# Patient Record
Sex: Female | Born: 1990 | Race: Black or African American | Hispanic: No | Marital: Single | Smoking: Never smoker
Health system: Southern US, Community
[De-identification: ages and names within clinical notes are randomized; demographics above are authoritative.]

## PROBLEM LIST (undated history)

## (undated) ENCOUNTER — Inpatient Hospital Stay: Payer: Self-pay

## (undated) DIAGNOSIS — M5430 Sciatica, unspecified side: Secondary | ICD-10-CM

## (undated) DIAGNOSIS — N63 Unspecified lump in unspecified breast: Secondary | ICD-10-CM

## (undated) DIAGNOSIS — R0602 Shortness of breath: Secondary | ICD-10-CM

## (undated) HISTORY — DX: Shortness of breath: R06.02

## (undated) HISTORY — PX: EYE SURGERY: SHX253

## (undated) HISTORY — DX: Sciatica, unspecified side: M54.30

---
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
## 2011-04-08 ENCOUNTER — Emergency Department: Payer: Self-pay | Admitting: Emergency Medicine

## 2011-04-08 LAB — COMPREHENSIVE METABOLIC PANEL
Albumin: 3.9 g/dL (ref 3.8–5.6)
Anion Gap: 9 (ref 7–16)
BUN: 7 mg/dL (ref 7–18)
Bilirubin,Total: 0.3 mg/dL (ref 0.2–1.0)
Chloride: 102 mmol/L (ref 98–107)
Co2: 28 mmol/L (ref 21–32)
Creatinine: 0.49 mg/dL — ABNORMAL LOW (ref 0.60–1.30)
EGFR (African American): >60
Glucose: 78 mg/dL (ref 65–99)
Osmolality: 274 (ref 275–301)
Potassium: 3.7 mmol/L (ref 3.5–5.1)
SGOT(AST): 17 U/L (ref 0–26)
SGPT (ALT): 13 U/L
Sodium: 139 mmol/L (ref 136–145)
Total Protein: 8.7 g/dL — ABNORMAL HIGH (ref 6.4–8.6)

## 2011-04-08 LAB — URINALYSIS, COMPLETE
Bacteria: NONE SEEN
Bilirubin,UR: NEGATIVE
Blood: NEGATIVE
Glucose,UR: NEGATIVE mg/dL (ref 0–75)
Ketone: NEGATIVE
Ph: 5 (ref 4.5–8.0)
Protein: NEGATIVE
RBC,UR: 1 /HPF (ref 0–5)
Specific Gravity: 1.01 (ref 1.003–1.030)
Squamous Epithelial: 12

## 2011-04-08 LAB — CBC
Platelet: 303 10*3/uL (ref 150–440)
RBC: 4.2 10*6/uL (ref 3.80–5.20)
WBC: 5.7 10*3/uL (ref 3.6–11.0)

## 2011-04-08 LAB — PREGNANCY, URINE: Pregnancy Test, Urine: NEGATIVE m[IU]/mL

## 2011-06-23 ENCOUNTER — Emergency Department: Payer: Self-pay | Admitting: Internal Medicine

## 2011-07-17 ENCOUNTER — Emergency Department: Payer: Self-pay | Admitting: Emergency Medicine

## 2011-07-17 LAB — URINALYSIS, COMPLETE
Bilirubin,UR: NEGATIVE
Blood: NEGATIVE
Glucose,UR: NEGATIVE mg/dL (ref 0–75)
Ketone: NEGATIVE
Ph: 7 (ref 4.5–8.0)
Protein: NEGATIVE
Squamous Epithelial: 17
WBC UR: 6 /HPF (ref 0–5)

## 2011-07-17 LAB — COMPREHENSIVE METABOLIC PANEL
Albumin: 3.7 g/dL — ABNORMAL LOW (ref 3.8–5.6)
Bilirubin,Total: 0.2 mg/dL (ref 0.2–1.0)
Chloride: 102 mmol/L (ref 98–107)
Co2: 30 mmol/L (ref 21–32)
Creatinine: 0.74 mg/dL (ref 0.60–1.30)
EGFR (Non-African Amer.): >60
Glucose: 88 mg/dL (ref 65–99)
Osmolality: 278 (ref 275–301)
SGPT (ALT): 13 U/L

## 2011-07-17 LAB — CBC
HCT: 34.7 % — ABNORMAL LOW (ref 35.0–47.0)
HGB: 11.1 g/dL — ABNORMAL LOW (ref 12.0–16.0)
MCH: 25.9 pg — ABNORMAL LOW (ref 26.0–34.0)
MCHC: 31.9 g/dL — ABNORMAL LOW (ref 32.0–36.0)
MCV: 81 fL (ref 80–100)
Platelet: 269 10*3/uL (ref 150–440)
RBC: 4.26 10*6/uL (ref 3.80–5.20)
WBC: 4.1 10*3/uL (ref 3.6–11.0)

## 2011-08-17 ENCOUNTER — Emergency Department: Payer: Self-pay | Admitting: Emergency Medicine

## 2011-08-18 LAB — URINALYSIS, COMPLETE
Bilirubin,UR: NEGATIVE
Glucose,UR: NEGATIVE mg/dL (ref 0–75)
Nitrite: NEGATIVE
Ph: 6 (ref 4.5–8.0)
Protein: 30
RBC,UR: 46 /HPF (ref 0–5)
WBC UR: 19 /HPF (ref 0–5)

## 2011-10-24 ENCOUNTER — Emergency Department: Payer: Self-pay | Admitting: *Deleted

## 2011-10-24 LAB — COMPREHENSIVE METABOLIC PANEL
BUN: 9 mg/dL (ref 7–18)
Calcium, Total: 8.6 mg/dL (ref 8.5–10.1)
Chloride: 105 mmol/L (ref 98–107)
Co2: 23 mmol/L (ref 21–32)
Creatinine: 0.6 mg/dL (ref 0.60–1.30)
EGFR (African American): >60
Osmolality: 272 (ref 275–301)
Potassium: 3.6 mmol/L (ref 3.5–5.1)
SGOT(AST): 26 U/L (ref 15–37)
SGPT (ALT): 18 U/L
Total Protein: 7 g/dL (ref 6.4–8.2)

## 2011-10-24 LAB — CBC
HCT: 31 % — ABNORMAL LOW (ref 35.0–47.0)
HGB: 10.5 g/dL — ABNORMAL LOW (ref 12.0–16.0)
MCHC: 33.8 g/dL (ref 32.0–36.0)
Platelet: 207 10*3/uL (ref 150–440)
RBC: 3.82 10*6/uL (ref 3.80–5.20)
RDW: 14.8 % — ABNORMAL HIGH (ref 11.5–14.5)
WBC: 7.6 10*3/uL (ref 3.6–11.0)

## 2011-10-24 LAB — URINALYSIS, COMPLETE
Bilirubin,UR: NEGATIVE
Glucose,UR: NEGATIVE mg/dL (ref 0–75)
Nitrite: NEGATIVE
RBC,UR: 1 /HPF (ref 0–5)
Squamous Epithelial: 4
WBC UR: 1 /HPF (ref 0–5)

## 2011-10-24 LAB — PREGNANCY, URINE: Pregnancy Test, Urine: POSITIVE m[IU]/mL

## 2011-10-26 ENCOUNTER — Emergency Department: Payer: Self-pay | Admitting: Emergency Medicine

## 2011-10-26 LAB — COMPREHENSIVE METABOLIC PANEL
Albumin: 3.2 g/dL — ABNORMAL LOW (ref 3.4–5.0)
Anion Gap: 7 (ref 7–16)
BUN: 7 mg/dL (ref 7–18)
Calcium, Total: 8.8 mg/dL (ref 8.5–10.1)
Chloride: 101 mmol/L (ref 98–107)
Co2: 26 mmol/L (ref 21–32)
Creatinine: 0.43 mg/dL — ABNORMAL LOW (ref 0.60–1.30)
EGFR (African American): >60
EGFR (Non-African Amer.): >60
Glucose: 73 mg/dL (ref 65–99)
Osmolality: 265 (ref 275–301)
Potassium: 3.7 mmol/L (ref 3.5–5.1)
SGOT(AST): 14 U/L — ABNORMAL LOW (ref 15–37)
SGPT (ALT): 17 U/L
Sodium: 134 mmol/L — ABNORMAL LOW (ref 136–145)
Total Protein: 7.6 g/dL (ref 6.4–8.2)

## 2011-10-26 LAB — CBC
HCT: 34.7 % — ABNORMAL LOW (ref 35.0–47.0)
MCHC: 33.7 g/dL (ref 32.0–36.0)
MCV: 82 fL (ref 80–100)
RDW: 14.6 % — ABNORMAL HIGH (ref 11.5–14.5)

## 2011-10-26 LAB — HCG, QUANTITATIVE, PREGNANCY: Beta Hcg, Quant.: 70778 m[IU]/mL — ABNORMAL HIGH

## 2011-11-21 ENCOUNTER — Encounter: Payer: Self-pay | Admitting: Maternal & Fetal Medicine

## 2012-03-28 ENCOUNTER — Inpatient Hospital Stay (HOSPITAL_COMMUNITY): Admission: AD | Admit: 2012-03-28 | Payer: Self-pay | Source: Ambulatory Visit | Admitting: Obstetrics & Gynecology

## 2012-04-05 ENCOUNTER — Observation Stay: Payer: Self-pay | Admitting: Obstetrics and Gynecology

## 2012-04-07 ENCOUNTER — Inpatient Hospital Stay: Payer: Self-pay | Admitting: Obstetrics and Gynecology

## 2012-04-07 LAB — CBC WITH DIFFERENTIAL/PLATELET
Basophil #: 0 10*3/uL (ref 0.0–0.1)
Eosinophil %: 0.6 %
Lymphocyte #: 1.3 10*3/uL (ref 1.0–3.6)
MCH: 23.7 pg — ABNORMAL LOW (ref 26.0–34.0)
Monocyte #: 1.6 x10 3/mm — ABNORMAL HIGH (ref 0.2–0.9)
Monocyte %: 10.9 %
Neutrophil #: 12.1 10*3/uL — ABNORMAL HIGH (ref 1.4–6.5)
Platelet: 199 10*3/uL (ref 150–440)
WBC: 15.1 10*3/uL — ABNORMAL HIGH (ref 3.6–11.0)

## 2012-04-08 LAB — CBC WITH DIFFERENTIAL/PLATELET
Comment - H1-Com1: NORMAL
HCT: 26.4 % — ABNORMAL LOW (ref 35.0–47.0)
Lymphocytes: 12 %
MCH: 23.2 pg — ABNORMAL LOW (ref 26.0–34.0)
MCV: 74 fL — ABNORMAL LOW (ref 80–100)
RBC: 3.56 10*6/uL — ABNORMAL LOW (ref 3.80–5.20)
Segmented Neutrophils: 77 %
WBC: 20.6 10*3/uL — ABNORMAL HIGH (ref 3.6–11.0)

## 2012-04-08 LAB — CREATININE, SERUM
EGFR (African American): >60
EGFR (Non-African Amer.): >60

## 2012-04-09 LAB — CBC WITH DIFFERENTIAL/PLATELET
Basophil %: 0.3 %
Eosinophil #: 0.2 10*3/uL (ref 0.0–0.7)
Lymphocyte %: 16.4 %
MCH: 22.3 pg — ABNORMAL LOW (ref 26.0–34.0)
MCV: 75 fL — ABNORMAL LOW (ref 80–100)
Monocyte #: 1.4 x10 3/mm — ABNORMAL HIGH (ref 0.2–0.9)
Monocyte %: 9.1 %
Neutrophil #: 10.9 10*3/uL — ABNORMAL HIGH (ref 1.4–6.5)
Platelet: 193 10*3/uL (ref 150–440)
RBC: 3.43 10*6/uL — ABNORMAL LOW (ref 3.80–5.20)
RDW: 16.4 % — ABNORMAL HIGH (ref 11.5–14.5)
WBC: 14.9 10*3/uL — ABNORMAL HIGH (ref 3.6–11.0)

## 2012-04-09 LAB — PATHOLOGY REPORT

## 2012-05-16 ENCOUNTER — Emergency Department: Payer: Self-pay | Admitting: Emergency Medicine

## 2012-07-03 ENCOUNTER — Emergency Department: Payer: Self-pay | Admitting: Emergency Medicine

## 2012-07-03 LAB — CBC
HCT: 35.2 % (ref 35.0–47.0)
HGB: 11.2 g/dL — ABNORMAL LOW (ref 12.0–16.0)
MCH: 24.1 pg — ABNORMAL LOW (ref 26.0–34.0)
MCHC: 31.9 g/dL — ABNORMAL LOW (ref 32.0–36.0)
MCV: 76 fL — ABNORMAL LOW (ref 80–100)
Platelet: 263 10*3/uL (ref 150–440)
RBC: 4.66 10*6/uL (ref 3.80–5.20)
WBC: 6.5 10*3/uL (ref 3.6–11.0)

## 2012-07-03 LAB — COMPREHENSIVE METABOLIC PANEL
Alkaline Phosphatase: 74 U/L (ref 50–136)
BUN: 11 mg/dL (ref 7–18)
Bilirubin,Total: 0.5 mg/dL (ref 0.2–1.0)
Calcium, Total: 8.6 mg/dL (ref 8.5–10.1)
Chloride: 103 mmol/L (ref 98–107)
Co2: 24 mmol/L (ref 21–32)
Potassium: 3.7 mmol/L (ref 3.5–5.1)
SGOT(AST): 13 U/L — ABNORMAL LOW (ref 15–37)
SGPT (ALT): 13 U/L (ref 12–78)
Sodium: 135 mmol/L — ABNORMAL LOW (ref 136–145)
Total Protein: 8 g/dL (ref 6.4–8.2)

## 2012-07-03 LAB — URINALYSIS, COMPLETE
Bilirubin,UR: NEGATIVE
Blood: NEGATIVE
Glucose,UR: NEGATIVE mg/dL (ref 0–75)
Protein: 30
RBC,UR: 1 /HPF (ref 0–5)
WBC UR: 1 /HPF (ref 0–5)

## 2012-07-03 LAB — LIPASE, BLOOD: Lipase: 84 U/L (ref 73–393)

## 2012-12-27 ENCOUNTER — Emergency Department: Payer: Self-pay | Admitting: Internal Medicine

## 2013-03-01 ENCOUNTER — Emergency Department: Payer: Self-pay | Admitting: Emergency Medicine

## 2013-03-01 LAB — BASIC METABOLIC PANEL
Anion Gap: 3 — ABNORMAL LOW (ref 7–16)
Calcium, Total: 9 mg/dL (ref 8.5–10.1)
Chloride: 100 mmol/L (ref 98–107)
Co2: 28 mmol/L (ref 21–32)
Creatinine: 0.61 mg/dL (ref 0.60–1.30)
EGFR (African American): >60
Glucose: 109 mg/dL — ABNORMAL HIGH (ref 65–99)
Osmolality: 260 (ref 275–301)
Potassium: 3.5 mmol/L (ref 3.5–5.1)
Sodium: 131 mmol/L — ABNORMAL LOW (ref 136–145)

## 2013-03-01 LAB — URINALYSIS, COMPLETE
Blood: NEGATIVE
Glucose,UR: NEGATIVE mg/dL (ref 0–75)
Leukocyte Esterase: NEGATIVE
Nitrite: NEGATIVE
Ph: 7 (ref 4.5–8.0)
Protein: NEGATIVE
RBC,UR: <1 /HPF (ref 0–5)
Squamous Epithelial: NONE SEEN

## 2013-03-01 LAB — CBC WITH DIFFERENTIAL/PLATELET
Basophil #: 0 10*3/uL (ref 0.0–0.1)
Eosinophil #: 0.1 10*3/uL (ref 0.0–0.7)
HGB: 11.8 g/dL — ABNORMAL LOW (ref 12.0–16.0)
Lymphocyte #: 0.4 10*3/uL — ABNORMAL LOW (ref 1.0–3.6)
MCH: 26.3 pg (ref 26.0–34.0)
MCHC: 32.9 g/dL (ref 32.0–36.0)
Monocyte #: 0.4 x10 3/mm (ref 0.2–0.9)
Monocyte %: 4.1 %
Neutrophil #: 8.4 10*3/uL — ABNORMAL HIGH (ref 1.4–6.5)
RBC: 4.5 10*6/uL (ref 3.80–5.20)
WBC: 9.2 10*3/uL (ref 3.6–11.0)

## 2013-03-01 LAB — RAPID INFLUENZA A&B ANTIGENS

## 2013-03-04 LAB — BETA STREP CULTURE(ARMC)

## 2013-06-21 ENCOUNTER — Emergency Department: Payer: Self-pay | Admitting: Emergency Medicine

## 2014-04-01 NOTE — L&D Delivery Note (Signed)
[facility]  Labor Events:   Preterm labor:   Rupture date:   Rupture time:   Rupture type: Spontaneous  Fluid Color: Clear  Induction:   Augmentation:   Complications:   Cervical ripening:          Delivery:   Episiotomy:   Lacerations:   Repair suture:   Repair # of packets:   Blood loss (ml): 150 ml   Information for the patient's newborn:  Esmond PlantsMiles, Girl [name] [[id number]][[id number]]    Delivery 01/15/2015 2:31 PM by  Vaginal, Spontaneous Delivery Sex:  female Gestational Age: 5769w6d Delivery Clinician:  Hildred LaserAnika Chinwe Lope Living?: Yes        APGARS  One minute Five minutes Ten minutes  Skin color: 0   1      Heart rate: 2   2      Grimace: 2   2      Muscle tone: 2   2      Breathing: 2   2      Totals: 8  9      Presentation/position: Vertex  Left Occiput Anterior Resuscitation: None  Cord information: 3 vessels   Disposition of cord blood:     Blood gases sent?  Complications:   Placenta: Delivered: 01/15/2015 2:39 PM     appearance Newborn Measurements: Weight: 6 lb 3.5 oz (2820 g)  Height: 18.9"  Head circumference: 30.5 cm  Chest circumference: 30.5 cm  Other providers:   Registered Nurse Corrie DandyJolene T Cannady Cindy A Tretha SciaraSharpe Jana M Grindheim  Additional  information: Forceps:   Vacuum:   Breech:   Observed anomalies         Delivery Note At 2:31 PM a viable and healthy female was delivered via Vaginal, Spontaneous Delivery (Presentation: Left Occiput Anterior).  APGAR: 8, 9; weight  .   Placenta status: spntaneous, intact.  Cord: 3 vessels with the following complications: .  Cord pH: not collected.  Cord blood collected.   Anesthesia: Epidural  Episiotomy: None Lacerations:  1st degree biateral periurethral; hemostatic Suture Repair: none Est. Blood Loss (mL):  150  Mom to postpartum.  Baby to Couplet care / Skin to Skin.  Hildred Lasernika Kemara Quigley 01/15/2015, 2:56 PM

## 2014-04-05 ENCOUNTER — Emergency Department: Payer: Self-pay | Admitting: Emergency Medicine

## 2014-06-01 ENCOUNTER — Emergency Department: Payer: Self-pay | Admitting: Student

## 2014-06-29 ENCOUNTER — Emergency Department
Admit: 2014-06-29 | Disposition: A | Discharge status: Home / Self Care | Payer: Self-pay | Admitting: Emergency Medicine

## 2014-07-08 LAB — OB RESULTS CONSOLE ABO/RH: RH TYPE: POSITIVE

## 2014-07-08 LAB — SICKLE CELL SCREEN: SICKLE CELL SCREEN: NEGATIVE

## 2014-07-08 LAB — OB RESULTS CONSOLE PLATELET COUNT: Platelets: 265 10*3/uL

## 2014-07-08 LAB — OB RESULTS CONSOLE GC/CHLAMYDIA
CHLAMYDIA, DNA PROBE: NEGATIVE
Gonorrhea: NEGATIVE

## 2014-07-08 LAB — OB RESULTS CONSOLE ANTIBODY SCREEN: ANTIBODY SCREEN: NEGATIVE

## 2014-07-08 LAB — OB RESULTS CONSOLE HIV ANTIBODY (ROUTINE TESTING): HIV: NONREACTIVE

## 2014-07-08 LAB — OB RESULTS CONSOLE RUBELLA ANTIBODY, IGM: Rubella: IMMUNE

## 2014-07-08 LAB — OB RESULTS CONSOLE HEPATITIS B SURFACE ANTIGEN: HEP B S AG: NEGATIVE

## 2014-07-08 LAB — OB RESULTS CONSOLE RPR: RPR: NONREACTIVE

## 2014-07-08 LAB — OB RESULTS CONSOLE VARICELLA ZOSTER ANTIBODY, IGG: VARICELLA IGG: IMMUNE

## 2014-07-08 LAB — OB RESULTS CONSOLE HGB/HCT, BLOOD
HCT: 36 %
Hemoglobin: 12 g/dL

## 2014-08-09 NOTE — H&P (Signed)
L&D Evaluation:  History:   HPI 24 year old G1 P0 with EDC=04/06/2012 by LMP=07/01/2011 and confirmed with a 9 week ultrasound presents at 40 1/7 weeks with c/o onset strong regular contractions at 0100 this AM and ctxs are now q6 min apart. Has had some bloody show, but no LOF. [facility] for [facility] onset of care and  a positive Chlamydia NAA in May and July with  negative TOC x2. Prenatal care also remarkable for a negative first trimester test and MSAFP, transient oligohydraminos at 20 weeks, and anemia. LABS: O POS, RI, VI, GBS negative.    Presents with contractions    Patient's Medical History Anemia    Patient's Surgical History none    Medications Pre Natal Vitamins    Allergies NKDA    Social History none    Family History Non-Contributory   ROS:   ROS All systems were reviewed.  HEENT, CNS, GI, GU, Respiratory, CV, Renal and Musculoskeletal systems were found to be normal.   Exam:   Vital Signs stable  initial temp=99    Urine Protein not completed    General breathing with contractions    Mental Status clear    Chest clear    Heart normal sinus rhythm, no murmur/gallop/rubs    Abdomen gravid, tender with contractions    Estimated Fetal Weight Average for gestational age    Fetal Position cephalic    Edema no edema    Reflexes 1+    Pelvic no external lesions, initially 2-2.5cm now 3/80%/-1    Mebranes Intact    FHT Description initially 150s-160 with mod variability. After IV fluid bolus baseline 140s with  accels to 160s to 170, mod variability    Ucx regular, q4-5 min    Skin dry   Impression:   Impression IUP at 40 1/7 weeks in early labor   Plan:   Plan EFM/NST, monitor contractions and for cervical change, IV bolus. Stadol for pain, epidural when in active labor   Electronic Signatures: Trinna BalloonGutierrez, Inara Dike L (CNM)  (Signed 07-Jan-14 11:20)  Authored: L&D Evaluation   Last Updated: 07-Jan-14 11:20 by Trinna BalloonGutierrez,  Sharisa Toves L (CNM)

## 2014-08-31 ENCOUNTER — Other Ambulatory Visit: Payer: Self-pay | Admitting: Obstetrics and Gynecology

## 2014-08-31 DIAGNOSIS — Z3490 Encounter for supervision of normal pregnancy, unspecified, unspecified trimester: Secondary | ICD-10-CM

## 2014-09-09 ENCOUNTER — Other Ambulatory Visit: Payer: Self-pay

## 2014-09-09 DIAGNOSIS — Z369 Encounter for antenatal screening, unspecified: Secondary | ICD-10-CM

## 2014-09-12 DIAGNOSIS — R0602 Shortness of breath: Secondary | ICD-10-CM | POA: Insufficient documentation

## 2014-09-12 DIAGNOSIS — M5430 Sciatica, unspecified side: Secondary | ICD-10-CM

## 2014-09-13 ENCOUNTER — Ambulatory Visit (INDEPENDENT_AMBULATORY_CARE_PROVIDER_SITE_OTHER): Payer: Medicaid Other | Admitting: Obstetrics and Gynecology

## 2014-09-13 ENCOUNTER — Ambulatory Visit: Payer: Medicaid Other

## 2014-09-13 ENCOUNTER — Encounter: Payer: Self-pay | Admitting: Obstetrics and Gynecology

## 2014-09-13 VITALS — BP 103/58 | HR 97 | Wt 137.8 lb

## 2014-09-13 DIAGNOSIS — Z3490 Encounter for supervision of normal pregnancy, unspecified, unspecified trimester: Secondary | ICD-10-CM

## 2014-09-13 DIAGNOSIS — Z3492 Encounter for supervision of normal pregnancy, unspecified, second trimester: Secondary | ICD-10-CM

## 2014-09-13 DIAGNOSIS — Z3482 Encounter for supervision of other normal pregnancy, second trimester: Secondary | ICD-10-CM | POA: Diagnosis not present

## 2014-09-13 LAB — POCT URINALYSIS DIPSTICK
Bilirubin, UA: NEGATIVE
Glucose, UA: NEGATIVE
Ketones, UA: NEGATIVE
Leukocytes, UA: NEGATIVE
Nitrite, UA: NEGATIVE
PROTEIN UA: NEGATIVE
SPEC GRAV UA: 1.02
Urobilinogen, UA: 0.2
pH, UA: 6

## 2014-09-13 NOTE — Progress Notes (Signed)
Patient doing well.  Denies complaints.  S/p anatomy scan with normal anatomy, growth and AFI appropriate. Female fetus.  RTC in 4 weeks.

## 2014-10-11 ENCOUNTER — Encounter: Payer: Medicaid Other | Admitting: Obstetrics and Gynecology

## 2014-10-18 ENCOUNTER — Ambulatory Visit (INDEPENDENT_AMBULATORY_CARE_PROVIDER_SITE_OTHER): Payer: Medicaid Other | Admitting: Obstetrics and Gynecology

## 2014-10-18 ENCOUNTER — Encounter: Payer: Self-pay | Admitting: Obstetrics and Gynecology

## 2014-10-18 VITALS — BP 87/57 | HR 94 | Wt 147.4 lb

## 2014-10-18 DIAGNOSIS — R252 Cramp and spasm: Secondary | ICD-10-CM

## 2014-10-18 DIAGNOSIS — O9989 Other specified diseases and conditions complicating pregnancy, childbirth and the puerperium: Secondary | ICD-10-CM

## 2014-10-18 DIAGNOSIS — O99891 Other specified diseases and conditions complicating pregnancy: Secondary | ICD-10-CM

## 2014-10-18 DIAGNOSIS — Z3492 Encounter for supervision of normal pregnancy, unspecified, second trimester: Secondary | ICD-10-CM

## 2014-10-18 LAB — POCT URINALYSIS DIPSTICK
Bilirubin, UA: NEGATIVE
Blood, UA: NEGATIVE
Glucose, UA: NEGATIVE
Ketones, UA: NEGATIVE
Leukocytes, UA: NEGATIVE
Nitrite, UA: NEGATIVE
PH UA: 7
Protein, UA: NEGATIVE
Spec Grav, UA: 1.01
Urobilinogen, UA: NEGATIVE

## 2014-10-18 NOTE — Progress Notes (Signed)
ROB: Patient complains of dizziness, headache, and continued leg cramps.  Has not followed recommendations regarding increasing potassium/calcium through supplements. Reiterated recommendations.  BP low today. Advised on increasing hydration. RTC in 4 weeks.  For 28 week labs, Tdap, and sign blood consents.

## 2014-11-15 ENCOUNTER — Ambulatory Visit (INDEPENDENT_AMBULATORY_CARE_PROVIDER_SITE_OTHER): Payer: Medicaid Other | Admitting: Obstetrics and Gynecology

## 2014-11-15 VITALS — BP 102/69 | HR 88 | Temp 97.9°F | Wt 154.0 lb

## 2014-11-15 DIAGNOSIS — Z3482 Encounter for supervision of other normal pregnancy, second trimester: Secondary | ICD-10-CM | POA: Diagnosis not present

## 2014-11-15 DIAGNOSIS — Z23 Encounter for immunization: Secondary | ICD-10-CM

## 2014-11-15 DIAGNOSIS — M79662 Pain in left lower leg: Secondary | ICD-10-CM

## 2014-11-15 DIAGNOSIS — Z3492 Encounter for supervision of normal pregnancy, unspecified, second trimester: Secondary | ICD-10-CM

## 2014-11-15 LAB — POCT URINALYSIS DIPSTICK
Bilirubin, UA: NEGATIVE
GLUCOSE UA: NEGATIVE
Ketones, UA: NEGATIVE
LEUKOCYTES UA: NEGATIVE
NITRITE UA: NEGATIVE
Protein, UA: NEGATIVE
RBC UA: NEGATIVE
Spec Grav, UA: 1.01
UROBILINOGEN UA: 0.2
pH, UA: 7

## 2014-11-15 NOTE — Progress Notes (Signed)
ROB: Patient reports complaints of occasionally feeling light headed.  Also notes pain in left calf.  Reports swelling in same leg several days ago but has decreased. Also notes insomnia.  Advised on staying hydrated, Benadryl/Unisom for sleep.  Will get left LE Doppler for r/o DVT. For glucola today, blood consent and Tdap done today.  RTC in 2 weeks.

## 2014-11-16 LAB — GLUCOSE TOLERANCE, 1 HOUR: GLUCOSE, 1HR PP: 70 mg/dL (ref 65–199)

## 2014-11-21 ENCOUNTER — Observation Stay
Admission: EM | Admit: 2014-11-21 | Discharge: 2014-11-21 | Disposition: A | Discharge status: Home / Self Care | Payer: Medicaid Other | Attending: Obstetrics and Gynecology | Admitting: Obstetrics and Gynecology

## 2014-11-21 DIAGNOSIS — O26893 Other specified pregnancy related conditions, third trimester: Principal | ICD-10-CM | POA: Insufficient documentation

## 2014-11-21 DIAGNOSIS — Z3A30 30 weeks gestation of pregnancy: Secondary | ICD-10-CM | POA: Diagnosis not present

## 2014-11-21 DIAGNOSIS — R51 Headache: Secondary | ICD-10-CM | POA: Insufficient documentation

## 2014-11-21 DIAGNOSIS — Z3490 Encounter for supervision of normal pregnancy, unspecified, unspecified trimester: Secondary | ICD-10-CM

## 2014-11-21 NOTE — Progress Notes (Signed)
Report given to Dr. Cherry.  

## 2014-11-21 NOTE — Progress Notes (Signed)
Pt given d/c inst and verbalized understanding.   Pt d/c home in stable condition with FOB.

## 2014-11-21 NOTE — Discharge Instructions (Signed)
May take Tylenol  q 4 hours not to use more than / day.   Take Kaopectate or Colace for constipation.   Keep appt on Wednesday at office.   Drink 8-10 glasses water daily and eat more fiber.

## 2014-11-21 NOTE — OB Triage Note (Signed)
Pt to L/D with c/o right side head with blurred vision and spots.  Mid abdominal pain.   No BM for 6 days.   Some tingling in left lower leg.   Next office appt is Wednesday

## 2014-11-22 ENCOUNTER — Ambulatory Visit (INDEPENDENT_AMBULATORY_CARE_PROVIDER_SITE_OTHER): Payer: Medicaid Other | Admitting: Obstetrics and Gynecology

## 2014-11-22 VITALS — BP 99/68 | HR 103 | Wt 151.2 lb

## 2014-11-22 DIAGNOSIS — O1200 Gestational edema, unspecified trimester: Secondary | ICD-10-CM | POA: Insufficient documentation

## 2014-11-22 DIAGNOSIS — Z3483 Encounter for supervision of other normal pregnancy, third trimester: Secondary | ICD-10-CM

## 2014-11-22 DIAGNOSIS — K0510 Chronic gingivitis, plaque induced: Secondary | ICD-10-CM

## 2014-11-22 DIAGNOSIS — Z3493 Encounter for supervision of normal pregnancy, unspecified, third trimester: Secondary | ICD-10-CM

## 2014-11-22 LAB — POCT URINALYSIS DIPSTICK
Bilirubin, UA: NEGATIVE
Blood, UA: NEGATIVE
Glucose, UA: NEGATIVE
Ketones, UA: NEGATIVE
LEUKOCYTES UA: NEGATIVE
NITRITE UA: NEGATIVE
PROTEIN UA: NEGATIVE
SPEC GRAV UA: 1.01
UROBILINOGEN UA: NEGATIVE
pH, UA: 5

## 2014-11-22 NOTE — Progress Notes (Signed)
ROB: Patient doing ok.  Notes that she went to the dentist today and found out that she had gum inflammation which was causing her to have headaches and jaw pain.  Was initiated on PCN. Seen in ER over the weekend for leg swelling, headaches, seeing spots.  Ruled out for pre-eclampsia. Did not get leg doppler study as ordered last visit as she states no one called her regarding scheduling. Notes that symptoms have resolved. Will hold on study for now as low suspicion for DVT currently. RTC in 2 weeks.

## 2014-12-07 ENCOUNTER — Ambulatory Visit (INDEPENDENT_AMBULATORY_CARE_PROVIDER_SITE_OTHER): Payer: Medicaid Other | Admitting: Obstetrics and Gynecology

## 2014-12-07 ENCOUNTER — Encounter: Payer: Self-pay | Admitting: Obstetrics and Gynecology

## 2014-12-07 VITALS — BP 93/57 | HR 101 | Wt 154.9 lb

## 2014-12-07 DIAGNOSIS — Z3493 Encounter for supervision of normal pregnancy, unspecified, third trimester: Secondary | ICD-10-CM

## 2014-12-07 DIAGNOSIS — Z23 Encounter for immunization: Secondary | ICD-10-CM

## 2014-12-07 LAB — POCT URINALYSIS DIPSTICK
Bilirubin, UA: 1
Blood, UA: NEGATIVE
Glucose, UA: NEGATIVE
Ketones, UA: NEGATIVE
NITRITE UA: NEGATIVE
SPEC GRAV UA: 1.02
UROBILINOGEN UA: 0.2
pH, UA: 6.5

## 2014-12-07 MED ORDER — INFLUENZA VAC SPLIT QUAD 0.5 ML IM SUSY
0.5000 mL | PREFILLED_SYRINGE | Freq: Once | INTRAMUSCULAR | Status: AC
  Administered 2014-12-07: 0.5 mL via INTRAMUSCULAR

## 2014-12-07 NOTE — Progress Notes (Signed)
Wants discuss flu vaccine with AC. BH ctx. Pain in vagina on/off.

## 2014-12-07 NOTE — Progress Notes (Signed)
ROB: Patient doing well, no complaints. For flu vaccine today.  RTC in 2 weeks.

## 2014-12-07 NOTE — Addendum Note (Signed)
Addended by: Marchelle Folks on: 12/07/2014 05:15 PM   Modules accepted: Orders

## 2014-12-22 ENCOUNTER — Encounter: Payer: Self-pay | Admitting: Obstetrics and Gynecology

## 2014-12-22 ENCOUNTER — Ambulatory Visit (INDEPENDENT_AMBULATORY_CARE_PROVIDER_SITE_OTHER): Payer: Medicaid Other | Admitting: Obstetrics and Gynecology

## 2014-12-22 VITALS — BP 94/62 | HR 103 | Wt 160.9 lb

## 2014-12-22 DIAGNOSIS — Z3493 Encounter for supervision of normal pregnancy, unspecified, third trimester: Secondary | ICD-10-CM

## 2014-12-22 DIAGNOSIS — Z3483 Encounter for supervision of other normal pregnancy, third trimester: Secondary | ICD-10-CM

## 2014-12-22 LAB — POCT URINALYSIS DIPSTICK
Bilirubin, UA: NEGATIVE
Blood, UA: NEGATIVE
GLUCOSE UA: NEGATIVE
Ketones, UA: NEGATIVE
NITRITE UA: NEGATIVE
Protein, UA: NEGATIVE
SPEC GRAV UA: 1.02
UROBILINOGEN UA: 0.2
pH, UA: 6.5

## 2014-12-22 NOTE — Progress Notes (Signed)
ROB: Patient doing well, no complaints.  RTC in 2 weeks. For 36 week labs at that time.

## 2014-12-22 NOTE — Progress Notes (Signed)
No complaints

## 2014-12-29 LAB — OB RESULTS CONSOLE GBS: GBS: NEGATIVE

## 2015-01-05 ENCOUNTER — Ambulatory Visit (INDEPENDENT_AMBULATORY_CARE_PROVIDER_SITE_OTHER): Payer: Medicaid Other | Admitting: Obstetrics and Gynecology

## 2015-01-05 VITALS — BP 110/75 | HR 105 | Wt 163.6 lb

## 2015-01-05 DIAGNOSIS — Z113 Encounter for screening for infections with a predominantly sexual mode of transmission: Secondary | ICD-10-CM

## 2015-01-05 DIAGNOSIS — Z3493 Encounter for supervision of normal pregnancy, unspecified, third trimester: Secondary | ICD-10-CM

## 2015-01-05 DIAGNOSIS — Z369 Encounter for antenatal screening, unspecified: Secondary | ICD-10-CM

## 2015-01-05 DIAGNOSIS — Z36 Encounter for antenatal screening of mother: Secondary | ICD-10-CM

## 2015-01-05 NOTE — Progress Notes (Signed)
ROB: Patient complains of pelvic pressure.  For 36 week labs today.  RTC in 1 week. PTL precautions given.  Patient unable to give urine specimen today. 36 week labs done today.  Considering NuvaRing or OCPs for contraception postpartum. PTL precautions.

## 2015-01-05 NOTE — Progress Notes (Signed)
ROB: Patient is doing well with no complaints today.

## 2015-01-05 NOTE — Addendum Note (Signed)
Addended by: Jackquline Denmark on: 01/05/2015 12:21 PM   Modules accepted: Orders

## 2015-01-07 LAB — GC/CHLAMYDIA PROBE AMP
CHLAMYDIA, DNA PROBE: NEGATIVE
Neisseria gonorrhoeae by PCR: NEGATIVE

## 2015-01-09 LAB — CULTURE, BETA STREP (GROUP B ONLY): Strep Gp B Culture: NEGATIVE

## 2015-01-12 ENCOUNTER — Ambulatory Visit (INDEPENDENT_AMBULATORY_CARE_PROVIDER_SITE_OTHER): Payer: Medicaid Other | Admitting: Obstetrics and Gynecology

## 2015-01-12 VITALS — BP 104/73 | HR 114 | Wt 159.1 lb

## 2015-01-12 DIAGNOSIS — Z3493 Encounter for supervision of normal pregnancy, unspecified, third trimester: Secondary | ICD-10-CM

## 2015-01-12 LAB — POCT URINALYSIS DIP (MANUAL ENTRY)
Bilirubin, UA: NEGATIVE
Blood, UA: NEGATIVE
GLUCOSE UA: NEGATIVE
Ketones, POC UA: NEGATIVE
Nitrite, UA: NEGATIVE
PROTEIN UA: NEGATIVE
Spec Grav, UA: <=1.005
UROBILINOGEN UA: 0.2
pH, UA: 7

## 2015-01-12 NOTE — Progress Notes (Signed)
ROB: Patient thinks that she may have broken her water yesterday, but notes that she has not noted contractions.  Nitrazine negative. Thin discharge noted.  Given reassurance. Cervix unchanged from last visit.  36 week labs neg, GBS neg.  RTC in 1 week.  Labor precautions given.

## 2015-01-14 ENCOUNTER — Inpatient Hospital Stay
Admission: EM | Admit: 2015-01-14 | Discharge: 2015-01-16 | DRG: 775 | Disposition: A | Discharge status: Home / Self Care | Payer: Medicaid Other | Attending: Obstetrics and Gynecology | Admitting: Obstetrics and Gynecology

## 2015-01-14 DIAGNOSIS — Z3A37 37 weeks gestation of pregnancy: Secondary | ICD-10-CM | POA: Diagnosis not present

## 2015-01-14 DIAGNOSIS — K219 Gastro-esophageal reflux disease without esophagitis: Secondary | ICD-10-CM | POA: Diagnosis present

## 2015-01-14 DIAGNOSIS — O9962 Diseases of the digestive system complicating childbirth: Secondary | ICD-10-CM | POA: Diagnosis present

## 2015-01-14 DIAGNOSIS — O4292 Full-term premature rupture of membranes, unspecified as to length of time between rupture and onset of labor: Principal | ICD-10-CM | POA: Diagnosis present

## 2015-01-14 DIAGNOSIS — M5430 Sciatica, unspecified side: Secondary | ICD-10-CM | POA: Diagnosis present

## 2015-01-14 DIAGNOSIS — Z3493 Encounter for supervision of normal pregnancy, unspecified, third trimester: Secondary | ICD-10-CM | POA: Diagnosis not present

## 2015-01-14 DIAGNOSIS — O4202 Full-term premature rupture of membranes, onset of labor within 24 hours of rupture: Secondary | ICD-10-CM | POA: Diagnosis present

## 2015-01-14 DIAGNOSIS — O99354 Diseases of the nervous system complicating childbirth: Secondary | ICD-10-CM | POA: Diagnosis present

## 2015-01-14 DIAGNOSIS — O411230 Chorioamnionitis, third trimester, not applicable or unspecified: Secondary | ICD-10-CM | POA: Diagnosis present

## 2015-01-14 DIAGNOSIS — O4200 Premature rupture of membranes, onset of labor within 24 hours of rupture, unspecified weeks of gestation: Secondary | ICD-10-CM | POA: Diagnosis present

## 2015-01-14 DIAGNOSIS — Z8249 Family history of ischemic heart disease and other diseases of the circulatory system: Secondary | ICD-10-CM | POA: Diagnosis not present

## 2015-01-14 DIAGNOSIS — O4290 Premature rupture of membranes, unspecified as to length of time between rupture and onset of labor, unspecified weeks of gestation: Secondary | ICD-10-CM | POA: Diagnosis present

## 2015-01-14 LAB — CBC
HCT: 30.5 % — ABNORMAL LOW (ref 35.0–47.0)
Hemoglobin: 10.1 g/dL — ABNORMAL LOW (ref 12.0–16.0)
MCH: 25.1 pg — AB (ref 26.0–34.0)
MCHC: 32.9 g/dL (ref 32.0–36.0)
MCV: 76.1 fL — AB (ref 80.0–100.0)
PLATELETS: 210 10*3/uL (ref 150–440)
RBC: 4.01 MIL/uL (ref 3.80–5.20)
RDW: 14.8 % — AB (ref 11.5–14.5)
WBC: 8.1 10*3/uL (ref 3.6–11.0)

## 2015-01-14 MED ORDER — OXYTOCIN 40 UNITS IN LACTATED RINGERS INFUSION - SIMPLE MED
1.0000 m[IU]/min | INTRAVENOUS | Status: DC
  Administered 2015-01-15: 1 m[IU]/min via INTRAVENOUS

## 2015-01-14 MED ORDER — OXYTOCIN 40 UNITS IN LACTATED RINGERS INFUSION - SIMPLE MED
62.5000 mL/h | INTRAVENOUS | Status: DC
  Administered 2015-01-15: 999 mL/h via INTRAVENOUS
  Filled 2015-01-14: qty 1000

## 2015-01-14 MED ORDER — ACETAMINOPHEN 325 MG PO TABS: 650.0000 mg | ORAL_TABLET | ORAL | Status: DC | PRN

## 2015-01-14 MED ORDER — ZOLPIDEM TARTRATE 5 MG PO TABS: 5.0000 mg | ORAL_TABLET | Freq: Every evening | ORAL | Status: DC | PRN

## 2015-01-14 MED ORDER — CITRIC ACID-SODIUM CITRATE 334-500 MG/5ML PO SOLN: 30.0000 mL | ORAL | Status: DC | PRN

## 2015-01-14 MED ORDER — LACTATED RINGERS IV SOLN
INTRAVENOUS | Status: DC
Start: 2015-01-14 — End: 2015-01-15
  Administered 2015-01-14: 125 mL/h via INTRAVENOUS

## 2015-01-14 MED ORDER — TERBUTALINE SULFATE 1 MG/ML IJ SOLN: 0.2500 mg | Freq: Once | INTRAMUSCULAR | Status: DC | PRN

## 2015-01-14 MED ORDER — ONDANSETRON HCL 4 MG/2ML IJ SOLN: 4.0000 mg | Freq: Four times a day (QID) | INTRAMUSCULAR | Status: DC | PRN

## 2015-01-14 MED ORDER — OXYTOCIN BOLUS FROM INFUSION: 500.0000 mL | INTRAVENOUS | Status: DC

## 2015-01-14 MED ORDER — BUTORPHANOL TARTRATE 1 MG/ML IJ SOLN
1.0000 mg | INTRAMUSCULAR | Status: DC | PRN
  Administered 2015-01-15: 1 mg via INTRAVENOUS
  Filled 2015-01-14: qty 1

## 2015-01-14 MED ORDER — LIDOCAINE HCL (PF) 1 % IJ SOLN
30.0000 mL | INTRAMUSCULAR | Status: DC | PRN
  Filled 2015-01-14: qty 30

## 2015-01-14 MED ORDER — LACTATED RINGERS IV SOLN: 500.0000 mL | INTRAVENOUS | Status: DC | PRN

## 2015-01-14 NOTE — H&P (Addendum)
Obstetric History and Physical  Krystal Richard is a 24 y.o. G2P1001 with IUP at [redacted]w[redacted]d presenting for ruptured membranes (occured at 3 pm today) with clear fluid. Patient states she has been having  irregular, mild in intensity, every 5-10 minutes contractions over the past hour, none vaginal bleeding, with active fetal movement.    Prenatal Course Source of Care: [facility]] with onset of care at 12 weeks Pregnancy complications or risks: Patient Active Problem List   Diagnosis Date Noted  . PROM with onset of labor within 24 hours of rupture 01/14/2015  . Flu vaccine need 12/07/2014  . Leg swelling in pregnancy 11/22/2014  . Pregnancy 11/21/2014  . Supervision of normal pregnancy 09/13/2014  . Sciatic pain 09/12/2014   She plans to breastfeed She desires oral contraceptives (estrogen/progesterone) for postpartum contraception.   Prenatal labs and studies: ABO, Rh: O/Positive/-- (04/08 0000) Antibody: Negative (04/08 0000) Rubella: Immune (04/08 0000) RPR: Nonreactive (04/08 0000)  HBsAg: Negative (04/08 0000)  HIV: Non-reactive (04/08 0000)  JYN:WGNFAOZH (09/29 0000) 1 hr Glucola  normal Genetic screening normal Anatomy US normal  Prenatal Transfer Tool  Maternal Diabetes: No Genetic Screening: Normal Maternal Ultrasounds/Referrals: Normal Fetal Ultrasounds or other Referrals:  None Maternal Substance Abuse:  No Significant Maternal Medications:  None Significant Maternal Lab Results: None  Past Medical History  Diagnosis Date  . Sciatic pain   . SOB (shortness of breath) on exertion     Past Surgical History  Procedure Laterality Date  . Eye surgery      OB History  Gravida Para Term Preterm AB SAB TAB Ectopic Multiple Living  # Outcome Date GA Lbr Len/2nd Weight Sex Delivery Anes PTL Lv  2 Current           1 Term 2014 [redacted]w[redacted]d  6 lb 10 oz (3.005 kg) F Vag-Spont  N Y      Social History   Social History  . Marital Status:  Single    Spouse Name: N/A  . Number of Children: N/A  . Years of Education: N/A   Social History Main Topics  . Smoking status: Never Smoker   . Smokeless tobacco: Never Used  . Alcohol Use: No  . Drug Use: No  . Sexual Activity: Yes    Birth Control/ Protection: None     Comment: Pregnant   Other Topics Concern  . Not on file   Social History Narrative    Family History  Problem Relation Age of Onset  . Hypertension Mother     Prescriptions prior to admission  Medication Sig Dispense Refill Last Dose  . Prenatal Vit-Fe Fumarate-FA (MULTIVITAMIN-PRENATAL) 27-0.8 MG TABS tablet Take 1 tablet by mouth daily at 12 noon.   01/14/2015 at Unknown time    No Known Allergies  Review of Systems: Negative except for what is mentioned in HPI.  Physical Exam: BP 122/75 mmHg  Pulse 111  Temp(Src) 98.6 F (37 C) (Oral)  Resp 20  LMP 04/25/2014 CONSTITUTIONAL: Well-developed, well-nourished female in no acute distress.  HENT:  Normocephalic, atraumatic, External right and left ear normal. Oropharynx is clear and moist EYES: Conjunctivae and EOM are normal. Pupils are equal, round, and reactive to light. No scleral icterus.  NECK: Normal range of motion, supple, no masses SKIN: Skin is warm and dry. No rash noted. Not diaphoretic. No erythema. No pallor. NEUROLGIC: Alert and oriented to person, place, and time.  Normal reflexes, muscle tone coordination. No cranial nerve deficit noted. PSYCHIATRIC: Normal mood and affect. Normal behavior. Normal judgment and thought content. CARDIOVASCULAR: Normal heart rate noted, regular rhythm RESPIRATORY: Effort and breath sounds normal, no problems with respiration noted ABDOMEN: Soft, nontender, nondistended, gravid. MUSCULOSKELETAL: Normal range of motion. No edema and no tenderness. 2+ distal pulses.  Cervical Exam: Dilatation 3 cm   Effacement 50%   Station -3   Presentation: cephalic FHT:  Baseline rate 140 bpm   Variability moderate   Accelerations present   Decelerations none Contractions: Every 3-5 mins   Pertinent Labs/Studies:   No results found for this or any previous visit (from the past 24 hour(s)).  Assessment : Krystal Richard is a 24 y.o. G2P1001 at 4078w5d being admitted for PROM, in latent labor.  Plan: Labor: Expectant management.  Induction/Augmentation as needed, per protocol FWB: Reassuring fetal heart tracing.  GBS negative Delivery plan: Hopeful for vaginal delivery   Hildred LaserAnika Stanisha Lorenz, MD [facility]

## 2015-01-15 ENCOUNTER — Encounter: Payer: Self-pay | Admitting: Anesthesiology

## 2015-01-15 ENCOUNTER — Inpatient Hospital Stay: Payer: Medicaid Other | Admitting: Anesthesiology

## 2015-01-15 DIAGNOSIS — Z3493 Encounter for supervision of normal pregnancy, unspecified, third trimester: Secondary | ICD-10-CM

## 2015-01-15 LAB — RAPID HIV SCREEN (HIV 1/2 AB+AG)
HIV 1/2 Antibodies: NONREACTIVE
HIV-1 P24 ANTIGEN - HIV24: NONREACTIVE

## 2015-01-15 LAB — TYPE AND SCREEN
ABO/RH(D): O POS
Antibody Screen: NEGATIVE

## 2015-01-15 LAB — ABO/RH: ABO/RH(D): O POS

## 2015-01-15 MED ORDER — ZOLPIDEM TARTRATE 5 MG PO TABS: 5.0000 mg | ORAL_TABLET | Freq: Every evening | ORAL | Status: DC | PRN

## 2015-01-15 MED ORDER — ONDANSETRON HCL 4 MG/2ML IJ SOLN
4.0000 mg | Freq: Three times a day (TID) | INTRAMUSCULAR | Status: DC | PRN
Start: 2015-01-15 — End: 2015-01-15

## 2015-01-15 MED ORDER — IBUPROFEN 600 MG PO TABS
600.0000 mg | ORAL_TABLET | Freq: Four times a day (QID) | ORAL | Status: DC
  Administered 2015-01-15 – 2015-01-16 (×4): 600 mg via ORAL
  Filled 2015-01-15 (×4): qty 1

## 2015-01-15 MED ORDER — DIPHENHYDRAMINE HCL 50 MG/ML IJ SOLN: 12.5000 mg | INTRAMUSCULAR | Status: DC | PRN

## 2015-01-15 MED ORDER — NALOXONE HCL 1 MG/ML IJ SOLN
1.0000 ug/kg/h | INTRAMUSCULAR | Status: DC | PRN
  Filled 2015-01-15: qty 2

## 2015-01-15 MED ORDER — FERROUS SULFATE 325 (65 FE) MG PO TABS: 325.0000 mg | ORAL_TABLET | Freq: Every day | ORAL | Status: DC

## 2015-01-15 MED ORDER — DIBUCAINE 1 % RE OINT: 1.0000 "application " | TOPICAL_OINTMENT | RECTAL | Status: DC | PRN

## 2015-01-15 MED ORDER — PRENATAL MULTIVITAMIN CH: 1.0000 | ORAL_TABLET | Freq: Every day | ORAL | Status: DC

## 2015-01-15 MED ORDER — SODIUM CHLORIDE 0.9 % IJ SOLN: 3.0000 mL | Freq: Two times a day (BID) | INTRAMUSCULAR | Status: DC

## 2015-01-15 MED ORDER — OXYCODONE-ACETAMINOPHEN 5-325 MG PO TABS
1.0000 | ORAL_TABLET | ORAL | Status: DC | PRN
  Administered 2015-01-16 (×2): 1 via ORAL
  Filled 2015-01-15 (×2): qty 1

## 2015-01-15 MED ORDER — NALBUPHINE HCL 10 MG/ML IJ SOLN
5.0000 mg | Freq: Once | INTRAMUSCULAR | Status: DC | PRN
  Filled 2015-01-15: qty 0.5

## 2015-01-15 MED ORDER — AMPICILLIN SODIUM 1 G IJ SOLR
1.0000 g | INTRAMUSCULAR | Status: DC
  Filled 2015-01-15 (×5): qty 1000

## 2015-01-15 MED ORDER — BENZOCAINE-MENTHOL 20-0.5 % EX AERO
1.0000 "application " | INHALATION_SPRAY | CUTANEOUS | Status: DC | PRN
  Filled 2015-01-15 (×3): qty 56

## 2015-01-15 MED ORDER — AMMONIA AROMATIC IN INHA
RESPIRATORY_TRACT | Status: AC
  Filled 2015-01-15: qty 20

## 2015-01-15 MED ORDER — OXYCODONE-ACETAMINOPHEN 5-325 MG PO TABS: 2.0000 | ORAL_TABLET | ORAL | Status: DC | PRN

## 2015-01-15 MED ORDER — DIPHENHYDRAMINE HCL 25 MG PO CAPS: 25.0000 mg | ORAL_CAPSULE | Freq: Four times a day (QID) | ORAL | Status: DC | PRN

## 2015-01-15 MED ORDER — SODIUM CHLORIDE 0.9 % IV SOLN: 2.0000 g | Freq: Once | INTRAVENOUS | Status: DC

## 2015-01-15 MED ORDER — SODIUM CHLORIDE 0.9 % IJ SOLN: 3.0000 mL | INTRAMUSCULAR | Status: DC | PRN

## 2015-01-15 MED ORDER — FENTANYL 2.5 MCG/ML W/ROPIVACAINE 0.2% IN NS 100 ML EPIDURAL INFUSION (ARMC-ANES): 10.0000 mL/h | EPIDURAL | Status: DC

## 2015-01-15 MED ORDER — ONDANSETRON HCL 4 MG PO TABS: 4.0000 mg | ORAL_TABLET | ORAL | Status: DC | PRN

## 2015-01-15 MED ORDER — SODIUM CHLORIDE 0.9 % IJ SOLN
INTRAMUSCULAR | Status: AC
  Filled 2015-01-15: qty 10

## 2015-01-15 MED ORDER — SODIUM CHLORIDE 0.9 % IV SOLN: 250.0000 mL | INTRAVENOUS | Status: DC | PRN

## 2015-01-15 MED ORDER — SODIUM CHLORIDE 0.9 % IJ SOLN
INTRAMUSCULAR | Status: AC
  Filled 2015-01-15: qty 9

## 2015-01-15 MED ORDER — KETOROLAC TROMETHAMINE 30 MG/ML IJ SOLN: 30.0000 mg | Freq: Four times a day (QID) | INTRAMUSCULAR | Status: DC | PRN

## 2015-01-15 MED ORDER — SIMETHICONE 80 MG PO CHEW: 80.0000 mg | CHEWABLE_TABLET | ORAL | Status: DC | PRN

## 2015-01-15 MED ORDER — ONDANSETRON HCL 4 MG/2ML IJ SOLN: 4.0000 mg | INTRAMUSCULAR | Status: DC | PRN

## 2015-01-15 MED ORDER — LANOLIN HYDROUS EX OINT: TOPICAL_OINTMENT | CUTANEOUS | Status: DC | PRN

## 2015-01-15 MED ORDER — NALBUPHINE HCL 10 MG/ML IJ SOLN
5.0000 mg | INTRAMUSCULAR | Status: DC | PRN
  Filled 2015-01-15: qty 0.5

## 2015-01-15 MED ORDER — DIPHENHYDRAMINE HCL 25 MG PO CAPS: 25.0000 mg | ORAL_CAPSULE | ORAL | Status: DC | PRN

## 2015-01-15 MED ORDER — MEPERIDINE HCL 25 MG/ML IJ SOLN: 6.2500 mg | INTRAMUSCULAR | Status: DC | PRN

## 2015-01-15 MED ORDER — LIDOCAINE-EPINEPHRINE (PF) 1.5 %-1:200000 IJ SOLN
INTRAMUSCULAR | Status: DC | PRN
  Administered 2015-01-15: 3 mL via EPIDURAL

## 2015-01-15 MED ORDER — WITCH HAZEL-GLYCERIN EX PADS: 1.0000 "application " | MEDICATED_PAD | CUTANEOUS | Status: DC | PRN

## 2015-01-15 MED ORDER — STERILE WATER FOR INJECTION IJ SOLN
INTRAMUSCULAR | Status: AC
  Filled 2015-01-15: qty 10

## 2015-01-15 MED ORDER — NALOXONE HCL 0.4 MG/ML IJ SOLN: 0.4000 mg | INTRAMUSCULAR | Status: DC | PRN

## 2015-01-15 MED ORDER — SODIUM CHLORIDE 0.9 % IV SOLN
INTRAVENOUS | Status: DC | PRN
  Administered 2015-01-15 (×4): 5 mL via EPIDURAL

## 2015-01-15 MED ORDER — FENTANYL 2.5 MCG/ML W/ROPIVACAINE 0.2% IN NS 100 ML EPIDURAL INFUSION (ARMC-ANES)
EPIDURAL | Status: AC
  Administered 2015-01-15: 10 mL/h via EPIDURAL
  Filled 2015-01-15: qty 100

## 2015-01-15 MED ORDER — ACETAMINOPHEN 325 MG PO TABS: 650.0000 mg | ORAL_TABLET | ORAL | Status: DC | PRN

## 2015-01-15 MED ORDER — SODIUM CHLORIDE 0.9 % IJ SOLN
INTRAMUSCULAR | Status: AC
  Filled 2015-01-15: qty 50

## 2015-01-15 MED ORDER — DOCUSATE SODIUM 100 MG PO CAPS
100.0000 mg | ORAL_CAPSULE | Freq: Two times a day (BID) | ORAL | Status: DC
  Filled 2015-01-15: qty 1

## 2015-01-15 NOTE — Progress Notes (Signed)
Intrapartum Progress Note  S: Patient notes mild discomfort with contractions.  O: Blood pressure 104/69, pulse 89, temperature 97.9 F (36.6 C), temperature source Oral, resp. rate 20, last menstrual period 04/25/2014. Gen App: NAD, comfortable Abdomen: soft, gravid FHT: baseline 145 bpm.  Accels present.  Decels absent. moderate in degree variability.   Tocometer: contractions q 4 minutes Cervix:  3/50/-2/c/SROM Extremities: Nontender, no edema.  Pitocin: 11 mIU  Labs:  Results for orders placed or performed during the hospital encounter of 01/14/15  OB RESULT CONSOLE Group B Strep  Result Value Ref Range   GBS Negative   CBC  Result Value Ref Range   WBC 8.1 3.6 - 11.0 K/uL   RBC 4.01 3.80 - 5.20 MIL/uL   Hemoglobin 10.1 (L) 12.0 - 16.0 g/dL   HCT 30.830.5 (L) 65.735.0 - 84.647.0 %   MCV 76.1 (L) 80.0 - 100.0 fL   MCH 25.1 (L) 26.0 - 34.0 pg   MCHC 32.9 32.0 - 36.0 g/dL   RDW 96.214.8 (H) 95.211.5 - 84.114.5 %   Platelets 210 150 - 440 K/uL  Rapid HIV screen (HIV 1/2 Ab+Ag) ([facility] Only)  Result Value Ref Range   HIV-1 P24 Antigen - HIV24 NON REACTIVE NON REACTIVE   HIV 1/2 Antibodies NON REACTIVE NON REACTIVE   Interpretation (HIV Ag Ab)      A non reactive test result means that HIV 1 or HIV 2 antibodies and HIV 1 p24 antigen were not detected in the specimen.  Type and screen [facility]  Result Value Ref Range   ABO/RH(D) O POS    Antibody Screen NEG    Sample Expiration 01/17/2015   ABO/Rh  Result Value Ref Range   ABO/RH(D) O POS     Assessment:  1: SIUP at 276w6d 2. SROM (since 3 pm yesterday)  Plan:  1. Continue active management of labor with pitocin 2. Continue to monitor for s/s of chorioamnionitis.  If undelivered by 24 hrs after rupture, will initiate prophylactic antibiotics. 3. Limit internal exams due to ruptured status   Hildred LaserAnika Tanetta Fuhriman, MD 01/15/2015 9:47 AM

## 2015-01-15 NOTE — Anesthesia Procedure Notes (Signed)
Epidural Patient location during procedure: OB Start time: 01/15/2015 12:10 PM End time: 01/15/2015 12:22 PM  Staffing Anesthesiologist: Lenard SimmerKARENZ, Melani Brisbane Performed by: anesthesiologist   Preanesthetic Checklist Completed: patient identified, site marked, surgical consent, pre-op evaluation, timeout performed, IV checked, risks and benefits discussed and monitors and equipment checked  Epidural Patient position: sitting Prep: ChloraPrep Patient monitoring: heart rate, cardiac monitor, continuous pulse ox and blood pressure Approach: midline Location: L4-L5 Injection technique: LOR saline  Needle:  Needle type: Tuohy  Needle gauge: 18 G Needle length: 9 cm Needle insertion depth: 5 cm Catheter type: closed end Catheter size: 20 Guage Catheter at skin depth: 10 cm Test dose: negative  Additional Notes Reason for block:procedure for pain

## 2015-01-15 NOTE — Progress Notes (Signed)
Intrapartum Progress Note  S: Patient denies complaints, is s/p epidural.  O: Blood pressure 110/67, pulse 94, temperature 97 F (36.1 C), temperature source Oral, resp. rate 22, last menstrual period 04/25/2014. Gen App: NAD, comfortable Abdomen: soft, gravid FHT: baseline 140 bpm.  Accels present.  Decels absent. moderate in degree variability.   Tocometer: contractions q 2 minutes Cervix:  5/50/-2/c/SROM Extremities: Nontender, no edema.  Pitocin: 15 mIU  Labs:  No new labs  Assessment:  1: SIUP at 1018w6d 2. SROM (since 3 pm yesterday)  Plan:  1. Continue active management of labor with pitocin 2. IUPC placed.  3. Continue to monitor for s/s of chorioamnionitis.  If undelivered by 24 hrs after rupture, will initiate prophylactic antibiotics. 3. Limit internal exams due to ruptured status.    Krystal LaserAnika Andrina Locken, MD 01/15/2015 1:18 PM

## 2015-01-15 NOTE — Anesthesia Preprocedure Evaluation (Signed)
Anesthesia Evaluation  Patient identified by MRN, date of birth, ID band Patient awake    Reviewed: Allergy & Precautions, H&P , NPO status , Patient's Chart, lab work & pertinent test results, reviewed documented beta blocker date and time   History of Anesthesia Complications Negative for: history of anesthetic complications  Airway Mallampati: II  TM Distance: >3 FB Neck ROM: full    Dental no notable dental hx. (+) Teeth Intact   Pulmonary neg pulmonary ROS,    Pulmonary exam normal breath sounds clear to auscultation       Cardiovascular Exercise Tolerance: Good negative cardio ROS Normal cardiovascular exam Rhythm:regular Rate:Normal     Neuro/Psych neg Seizures  Neuromuscular disease (Sciatica) negative psych ROS   GI/Hepatic Neg liver ROS, GERD  ,  Endo/Other  negative endocrine ROS  Renal/GU negative Renal ROS  negative genitourinary   Musculoskeletal   Abdominal   Peds  Hematology negative hematology ROS (+)   Anesthesia Other Findings Past Medical History:   Sciatic pain                                                 SOB (shortness of breath) on exertion                        Reproductive/Obstetrics (+) Pregnancy                             Anesthesia Physical Anesthesia Plan  ASA: II  Anesthesia Plan: Epidural   Post-op Pain Management:    Induction:   Airway Management Planned:   Additional Equipment:   Intra-op Plan:   Post-operative Plan:   Informed Consent: I have reviewed the patients History and Physical, chart, labs and discussed the procedure including the risks, benefits and alternatives for the proposed anesthesia with the patient or authorized representative who has indicated his/her understanding and acceptance.   Dental Advisory Given  Plan Discussed with: Anesthesiologist, CRNA and Surgeon  Anesthesia Plan Comments:         Anesthesia  Quick Evaluation

## 2015-01-16 ENCOUNTER — Encounter: Payer: Self-pay | Admitting: Obstetrics and Gynecology

## 2015-01-16 LAB — CBC
HEMATOCRIT: 28.2 % — AB (ref 35.0–47.0)
HEMOGLOBIN: 9.1 g/dL — AB (ref 12.0–16.0)
MCH: 24.5 pg — ABNORMAL LOW (ref 26.0–34.0)
MCHC: 32.2 g/dL (ref 32.0–36.0)
MCV: 75.9 fL — AB (ref 80.0–100.0)
Platelets: 180 10*3/uL (ref 150–440)
RBC: 3.71 MIL/uL — ABNORMAL LOW (ref 3.80–5.20)
RDW: 15.1 % — ABNORMAL HIGH (ref 11.5–14.5)
WBC: 12.6 10*3/uL — AB (ref 3.6–11.0)

## 2015-01-16 LAB — RPR: RPR: NONREACTIVE

## 2015-01-16 MED ORDER — IBUPROFEN 800 MG PO TABS: 800.0000 mg | ORAL_TABLET | Freq: Three times a day (TID) | ORAL | Status: DC | PRN

## 2015-01-16 MED ORDER — FERROUS SULFATE 325 (65 FE) MG PO TABS
325.0000 mg | ORAL_TABLET | Freq: Two times a day (BID) | ORAL | Status: DC
Start: 2015-01-16 — End: 2016-03-09

## 2015-01-16 MED ORDER — DOCUSATE SODIUM 100 MG PO CAPS: 100.0000 mg | ORAL_CAPSULE | Freq: Two times a day (BID) | ORAL | Status: DC | PRN

## 2015-01-16 NOTE — Progress Notes (Signed)
Post Partum Day #1 s/p SVD  Subjective: no complaints, up ad lib, voiding and tolerating PO.  Patient desires to go home today if possible.   Objective: Blood pressure 100/54, pulse 88, temperature 97.8 F (36.6 C), temperature source Oral, resp. rate 18, height 5\' 7"  (1.702 m), weight 159 lb (72.122 kg), last menstrual period 04/25/2014, SpO2 100 %, unknown if currently breastfeeding.  Physical Exam:  General: alert and no distress Lochia: appropriate Uterine Fundus: firm Incision: none DVT Evaluation: Negative Homan's sign. No cords or calf tenderness. No significant calf/ankle edema.   Recent Labs  01/14/15 2313 01/16/15 0624  HGB 10.1* 9.1*  HCT 30.5* 28.2*    Assessment/Plan: Discharge home, Breastfeeding and Contraception OCPs   LOS: 2 days   Krystal Richard 01/16/2015, 11:44 AM

## 2015-01-16 NOTE — Anesthesia Postprocedure Evaluation (Signed)
  Anesthesia Post-op Note  Patient: Krystal RyderChelsea M Richard  Procedure(s) Performed: * No procedures listed *  Anesthesia type:No value filed.  Patient location: Floor  Post pain: Pain level controlled  Post assessment: Post-op Vital signs reviewed, Patient's Cardiovascular Status Stable, Respiratory Function Stable, Patent Airway and No signs of Nausea or vomiting  Post vital signs: Reviewed and stable  Last Vitals:  Filed Vitals:   01/16/15 0400  BP: 103/60  Pulse: 102  Temp: 36.5 C  Resp: 16    Level of consciousness: awake, alert  and patient cooperative  Complications: No apparent anesthesia complications

## 2015-01-16 NOTE — Progress Notes (Signed)
D/C order from MD.  Reviewed d/c instructions and prescriptions with patient and answered any questions.  Patient d/c home with infant via wheelchair by nursing/auxillary. 

## 2015-01-16 NOTE — Discharge Instructions (Signed)

## 2015-01-16 NOTE — Anesthesia Post-op Follow-up Note (Signed)
  Anesthesia Pain Follow-up Note  Patient: Krystal Richard  Day #: 1  Date of Follow-up: 01/16/2015 Time: 7:20 AM  Last Vitals:  Filed Vitals:   01/16/15 0400  BP: 103/60  Pulse: 102  Temp: 36.5 C  Resp: 16    Level of Consciousness: alert  Pain: mild   Side Effects:None  Catheter Site Exam: Not evaluated  Plan: D/C from anesthesia care  Clydene PughBeane, Giovanna Kemmerer D

## 2015-01-16 NOTE — Discharge Summary (Signed)
Obstetric Discharge Summary Reason for Admission: rupture of membranes Prenatal Procedures: ultrasound Intrapartum Procedures: spontaneous vaginal delivery Postpartum Procedures: none Complications-Operative and Postpartum: 1st degree hemostatic bilateral periurethral laceration HEMOGLOBIN  Date Value Ref Range Status  01/16/2015 9.1* 12.0 - 16.0 g/dL Final  16/10/960404/10/2014 54.012.0 g/dL Final   HGB  Date Value Ref Range Status  03/01/2013 11.8* 12.0-16.0 g/dL Final   HCT  Date Value Ref Range Status  01/16/2015 28.2* 35.0 - 47.0 % Final  07/08/2014 36 % Final  03/01/2013 35.9 35.0-47.0 % Final    Physical Exam:  General: alert and no distress Lochia: appropriate Uterine Fundus: firm Incision: none DVT Evaluation: Negative Homan's sign. No cords or calf tenderness. No significant calf/ankle edema.  Discharge Diagnoses: Term Pregnancy-delivered and PROM x23 hours  Discharge Information: Date: 01/16/2015 Activity: pelvic rest Diet: routine Medications: PNV, Ibuprofen, Colace and Iron Condition: stable Instructions: refer to practice specific booklet Discharge to: home   Newborn Data: Live born female  Birth Weight: 6 lb 3.5 oz (2820 g) APGAR: 8, 9  Home with mother.  Krystal Richard 01/16/2015, 11:49 AM

## 2015-01-17 ENCOUNTER — Telehealth: Payer: Self-pay | Admitting: Obstetrics and Gynecology

## 2015-01-17 NOTE — Telephone Encounter (Signed)
Where she had epidural her lower back is still very painful. Tylenol is not working. She said the medicine is running in and she is still in labor.

## 2015-01-18 MED ORDER — ACETAMINOPHEN-CODEINE #3 300-30 MG PO TABS: 1.0000 | ORAL_TABLET | ORAL | Status: DC | PRN

## 2015-01-18 NOTE — Telephone Encounter (Signed)
Contacted patient, who notes moderate back pain at site of epidural, not relieved by Ibuprofen and Tylenol.   Also notes a knot. States that she has episodes where it feels as though her lower body goes numb and it is difficulty to move. Also reports bilateral lower extremity swelling.  Denies headaches, vision changes, RUQ pain.  Advised on compression stockings, elevating legs. Will prescribe T#3 for pain, advised that if pain and numbness still present in a.m., to come to hospital to be evaluated by Anesthesia.

## 2015-01-19 ENCOUNTER — Encounter: Payer: Medicaid Other | Admitting: Obstetrics and Gynecology

## 2015-02-28 ENCOUNTER — Encounter: Payer: Self-pay | Admitting: Obstetrics and Gynecology

## 2015-02-28 ENCOUNTER — Ambulatory Visit (INDEPENDENT_AMBULATORY_CARE_PROVIDER_SITE_OTHER): Payer: Medicaid Other | Admitting: Obstetrics and Gynecology

## 2015-02-28 VITALS — BP 96/62 | HR 115 | Ht 67.0 in | Wt 150.2 lb

## 2015-02-28 DIAGNOSIS — D6489 Other specified anemias: Secondary | ICD-10-CM

## 2015-02-28 MED ORDER — ETONOGESTREL-ETHINYL ESTRADIOL 0.12-0.015 MG/24HR VA RING: VAGINAL_RING | VAGINAL | Status: DC

## 2015-02-28 NOTE — Progress Notes (Signed)
OBSTETRICS/GYNECOLOGY POSTPARTUM CLINIC VISIT   Subjective:     Krystal Richard is a 24 y.o. [id number] female who presents for a postpartum visit. She is 6 weeks postpartum following a spontaneous vaginal delivery. I have fully reviewed the prenatal and intrapartum course. The delivery was at 37.6 gestational weeks.  Anesthesia: epidural. Postpartum course has been well. Baby's course has been well. Baby is feeding by breast. Bleeding no bleeding, has not resumed menses. Bowel function is normal. Bladder function is normal. Patient is not sexually active. Contraception method is NuvaRing vaginal inserts. Postpartum depression screening: negative.  The following portions of the patient's history were reviewed and updated as appropriate: allergies, current medications, past family history, past medical history, past social history, past surgical history and problem list.  Review of Systems A comprehensive review of systems was negative.   Objective:    BP 96/62 mmHg  Pulse 115  Ht 5\' 7"  (1.702 m)  Wt 150 lb 3.2 oz (68.13 kg)  BMI 23.52 kg/m2  Breastfeeding? Yes  Repeat pulse 100 General:  alert and no distress   Breasts:  inspection negative, no nipple discharge or bleeding, no masses or nodularity palpable  Lungs: clear to auscultation bilaterally  Heart:  regular rate and rhythm, S1, S2 normal, no murmur, click, rub or gallop  Abdomen: soft, non-tender; bowel sounds normal; no masses,  no organomegaly   Vulva:  normal  Vagina: normal vagina, no discharge, exudate, lesion, or erythema  Cervix:  multiparous appearance and no lesions  Corpus: normal size, contour, position, consistency, mobility, non-tender  Adnexa:  normal adnexa and no mass, fullness, tenderness  Rectal Exam: Not performed.         Lab Results  Component Value Date   HGB 9.1* 01/16/2015    Assessment:    Routine postpartum exam. Pap smear not done at today's visit.   Plan:    1. Contraception: NuvaRing  vaginal inserts. Prescribed. 2. Hgb checked for h/o asymptomatic anemia.  3. Follow up in: 6 months for annual exam, or as needed.     Hildred LaserAnika Ebunoluwa Gernert, MD [facility]

## 2015-03-01 LAB — HEMOGLOBIN AND HEMATOCRIT, BLOOD
HEMATOCRIT: 36.2 % (ref 34.0–46.6)
Hemoglobin: 11.3 g/dL (ref 11.1–15.9)

## 2015-04-02 NOTE — L&D Delivery Note (Signed)
Called for delivery. Pt began to push and Dr Elesa MassedWard on unit. NSVD of viable female delivered with CAN x 1 loose. Vtx with OA position and then body del at 1006 with cord clamped x 2 and cut after 30 secs due to baby needing evaluation. To warmer. Arrived with the head crowning. Dr Elesa MassedWard delivered pt and then I took over delivering placenta intact and perineum evaluted with bilat peri-urethral lacs non-bleeding. FF and lochia mod, No needles and no sponges. VSS. Hemostasis achieve. Pt tol well. Baby: weight: 7#11 oz/3500 g.Apgars 7-9. Baby evalauted by RN and then neonatologist arrived to evaluate. Cord blood sent.

## 2015-04-08 ENCOUNTER — Encounter: Payer: Self-pay | Admitting: Emergency Medicine

## 2015-04-08 ENCOUNTER — Emergency Department
Admission: EM | Admit: 2015-04-08 | Discharge: 2015-04-08 | Disposition: A | Discharge status: Home / Self Care | Payer: Medicaid Other | Attending: Emergency Medicine | Admitting: Emergency Medicine

## 2015-04-08 DIAGNOSIS — R197 Diarrhea, unspecified: Secondary | ICD-10-CM | POA: Insufficient documentation

## 2015-04-08 DIAGNOSIS — R1013 Epigastric pain: Secondary | ICD-10-CM | POA: Insufficient documentation

## 2015-04-08 DIAGNOSIS — R519 Headache, unspecified: Secondary | ICD-10-CM

## 2015-04-08 DIAGNOSIS — R112 Nausea with vomiting, unspecified: Secondary | ICD-10-CM | POA: Insufficient documentation

## 2015-04-08 DIAGNOSIS — Z79899 Other long term (current) drug therapy: Secondary | ICD-10-CM | POA: Insufficient documentation

## 2015-04-08 DIAGNOSIS — R1030 Lower abdominal pain, unspecified: Secondary | ICD-10-CM | POA: Insufficient documentation

## 2015-04-08 DIAGNOSIS — R51 Headache: Secondary | ICD-10-CM | POA: Insufficient documentation

## 2015-04-08 LAB — CBC WITH DIFFERENTIAL/PLATELET
Basophils Absolute: 0 10*3/uL (ref 0–0.1)
Basophils Relative: 0 %
EOS ABS: 0 10*3/uL (ref 0–0.7)
EOS PCT: 1 %
HCT: 40.1 % (ref 35.0–47.0)
Hemoglobin: 13 g/dL (ref 12.0–16.0)
LYMPHS ABS: 0.5 10*3/uL — AB (ref 1.0–3.6)
LYMPHS PCT: 6 %
MCH: 25.1 pg — AB (ref 26.0–34.0)
MCHC: 32.4 g/dL (ref 32.0–36.0)
MCV: 77.6 fL — AB (ref 80.0–100.0)
MONO ABS: 0.4 10*3/uL (ref 0.2–0.9)
MONOS PCT: 5 %
Neutro Abs: 7.5 10*3/uL — ABNORMAL HIGH (ref 1.4–6.5)
Neutrophils Relative %: 88 %
PLATELETS: 281 10*3/uL (ref 150–440)
RBC: 5.16 MIL/uL (ref 3.80–5.20)
RDW: 17.7 % — AB (ref 11.5–14.5)
WBC: 8.6 10*3/uL (ref 3.6–11.0)

## 2015-04-08 LAB — URINALYSIS COMPLETE WITH MICROSCOPIC (ARMC ONLY)
BILIRUBIN URINE: NEGATIVE
Bacteria, UA: NONE SEEN
Glucose, UA: NEGATIVE mg/dL
Hgb urine dipstick: NEGATIVE
Leukocytes, UA: NEGATIVE
Nitrite: NEGATIVE
PH: 5 (ref 5.0–8.0)
Protein, ur: NEGATIVE mg/dL
Specific Gravity, Urine: 1.027 (ref 1.005–1.030)

## 2015-04-08 LAB — COMPREHENSIVE METABOLIC PANEL
ALK PHOS: 77 U/L (ref 38–126)
ALT: 17 U/L (ref 14–54)
AST: 18 U/L (ref 15–41)
Albumin: 4.7 g/dL (ref 3.5–5.0)
Anion gap: 6 (ref 5–15)
BUN: 11 mg/dL (ref 6–20)
CALCIUM: 9.4 mg/dL (ref 8.9–10.3)
CHLORIDE: 104 mmol/L (ref 101–111)
CO2: 27 mmol/L (ref 22–32)
CREATININE: 0.51 mg/dL (ref 0.44–1.00)
GFR calc Af Amer: >60 mL/min (ref >60)
GFR calc non Af Amer: >60 mL/min (ref >60)
GLUCOSE: 96 mg/dL (ref 65–99)
Potassium: 4.2 mmol/L (ref 3.5–5.1)
SODIUM: 137 mmol/L (ref 135–145)
Total Bilirubin: 0.6 mg/dL (ref 0.3–1.2)
Total Protein: 8.9 g/dL — ABNORMAL HIGH (ref 6.5–8.1)

## 2015-04-08 LAB — LIPASE, BLOOD: Lipase: 20 U/L (ref 11–51)

## 2015-04-08 MED ORDER — SODIUM CHLORIDE 0.9 % IV BOLUS (SEPSIS)
1000.0000 mL | Freq: Once | INTRAVENOUS | Status: AC
  Administered 2015-04-08: 1000 mL via INTRAVENOUS

## 2015-04-08 MED ORDER — ONDANSETRON HCL 4 MG/2ML IJ SOLN
4.0000 mg | Freq: Once | INTRAMUSCULAR | Status: AC
  Administered 2015-04-08: 4 mg via INTRAVENOUS
  Filled 2015-04-08: qty 2

## 2015-04-08 MED ORDER — RANITIDINE HCL 150 MG PO CAPS: 150.0000 mg | ORAL_CAPSULE | Freq: Two times a day (BID) | ORAL | Status: DC

## 2015-04-08 MED ORDER — KETOROLAC TROMETHAMINE 30 MG/ML IJ SOLN
15.0000 mg | Freq: Once | INTRAMUSCULAR | Status: AC
  Administered 2015-04-08: 15 mg via INTRAVENOUS
  Filled 2015-04-08: qty 1

## 2015-04-08 MED ORDER — METOCLOPRAMIDE HCL 5 MG/ML IJ SOLN
10.0000 mg | Freq: Once | INTRAMUSCULAR | Status: AC
  Administered 2015-04-08: 10 mg via INTRAVENOUS
  Filled 2015-04-08: qty 2

## 2015-04-08 MED ORDER — ONDANSETRON 8 MG PO TBDP: 8.0000 mg | ORAL_TABLET | Freq: Three times a day (TID) | ORAL | Status: DC | PRN

## 2015-04-08 NOTE — ED Notes (Signed)
Pt states migraine started 2 days ago, has been taking tylenol with no relief, is breastfeeding so she can't take anything else at home. Vomiting started early am, pt states she can't keep anything down.

## 2015-04-08 NOTE — ED Provider Notes (Signed)
[facility] Emergency Department Provider Note  ____________________________________________  Time seen: 3:00 PM  I have reviewed the triage vital signs and the nursing notes.   HISTORY  Chief Complaint Migraine and Emesis    HPI Krystal Richard is a 25 y.o. female who complains of nausea vomiting and diarrhea today along with gradual onset headache that got on for 2 days. She has a history of sinus headaches in the past in the face and behind the eyes, but this headache is located more in the bilateral forehead. She denies recent illness other than the current vomiting and diarrhea. She is currently breast-feeding and 2 months postpartum. Denies any other medical problems or symptoms. No chest pain shortness of breath or fever. No syncope.     Past Medical History  Diagnosis Date  . Sciatic pain   . SOB (shortness of breath) on exertion      Patient Active Problem List   Diagnosis Date Noted  . PROM with onset of labor within 24 hours of rupture 01/14/2015  . PROM (premature rupture of membranes) 01/14/2015  . Flu vaccine need 12/07/2014  . Leg swelling in pregnancy 11/22/2014  . Pregnancy 11/21/2014  . Supervision of normal pregnancy 09/13/2014  . Sciatic pain 09/12/2014     Past Surgical History  Procedure Laterality Date  . Eye surgery       Current Outpatient Rx  Name  Route  Sig  Dispense  Refill  . acetaminophen-codeine (TYLENOL #3) 300-30 MG tablet   Oral   Take 1-2 tablets by mouth every 4 (four) hours as needed for moderate pain.   30 tablet   0   . docusate sodium (COLACE) 100 MG capsule   Oral   Take 1 capsule (100 mg total) by mouth 2 (two) times daily as needed.   30 capsule   2   . etonogestrel-ethinyl estradiol (NUVARING) 0.12-0.015 MG/24HR vaginal ring      Insert vaginally and leave in place for 3 consecutive weeks, then remove for 1 week.   1 each   12   . ferrous sulfate 325 (65 FE) MG tablet   Oral    Take 1 tablet (325 mg total) by mouth 2 (two) times daily with a meal.   60 tablet   2   . ibuprofen (ADVIL,MOTRIN) 800 MG tablet   Oral   Take 1 tablet (800 mg total) by mouth every 8 (eight) hours as needed.   60 tablet   1   . ondansetron (ZOFRAN ODT) 8 MG disintegrating tablet   Oral   Take 1 tablet (8 mg total) by mouth every 8 (eight) hours as needed for nausea or vomiting.   20 tablet   0   . Prenatal Vit-Fe Fumarate-FA (MULTIVITAMIN-PRENATAL) 27-0.8 MG TABS tablet   Oral   Take 1 tablet by mouth daily at 12 noon.         . ranitidine (ZANTAC) 150 MG capsule   Oral   Take 1 capsule (150 mg total) by mouth 2 (two) times daily.   28 capsule   0      Allergies Review of patient's allergies indicates no known allergies.   Family History  Problem Relation Age of Onset  . Hypertension Mother     Social History Social History  Substance Use Topics  . Smoking status: Never Smoker   . Smokeless tobacco: Never Used  . Alcohol Use: No    Review of Systems  Constitutional:  No fever or chills. No weight changes Eyes:   No blurry vision or double vision.  ENT:   No sore throat. Cardiovascular:   No chest pain. Respiratory:   No dyspnea or cough. Gastrointestinal:   Positive epigastric pain with vomiting and diarrhea..  No BRBPR or melena. Genitourinary:   Negative for dysuria, urinary retention, bloody urine, or difficulty urinating. Musculoskeletal:   Negative for back pain. No joint swelling or pain. Skin:   Negative for rash. Neurological:   Positive for headaches, without focal weakness or numbness. Psychiatric:  No anxiety or depression.   Endocrine:  No hot/cold intolerance, changes in energy, or sleep difficulty.  10-point ROS otherwise negative.  ____________________________________________   PHYSICAL EXAM:  VITAL SIGNS: ED Triage Vitals  Enc Vitals Group     BP 04/08/15 1444 110/87 mmHg     Pulse Rate 04/08/15 1444 109     Resp 04/08/15  1444 18     Temp 04/08/15 1444 98.2 F (36.8 C)     Temp Source 04/08/15 1444 Oral     SpO2 04/08/15 1448 100 %     Weight 04/08/15 1444 150 lb (68.04 kg)     Height 04/08/15 1444 5\' 6"  (1.676 m)     Head Cir --      Peak Flow --      Pain Score 04/08/15 1446 8     Pain Loc --      Pain Edu? --      Excl. in GC? --     Vital signs reviewed, nursing assessments reviewed.   Constitutional:   Alert and oriented. Well appearing and in no distress. Very calm comfortable and smiling Eyes:   No scleral icterus. No conjunctival pallor. PERRL. EOMI ENT   Head:   Normocephalic and atraumatic.   Nose:   No congestion/rhinnorhea. No septal hematoma   Mouth/Throat:   MMM, mild pharyngeal erythema. No peritonsillar mass. No uvula shift.   Neck:   No stridor. No SubQ emphysema. No meningismus. Hematological/Lymphatic/Immunilogical:   No cervical lymphadenopathy. Cardiovascular:   RRR. Normal and symmetric distal pulses are present in all extremities. No murmurs, rubs, or gallops. Respiratory:   Normal respiratory effort without tachypnea nor retractions. Breath sounds are clear and equal bilaterally. No wheezes/rales/rhonchi. Gastrointestinal:   Soft with epigastric tenderness and suprapubic tenderness. No distention. There is no CVA tenderness.  No rebound, rigidity, or guarding. Genitourinary:   deferred Musculoskeletal:   Nontender with normal range of motion in all extremities. No joint effusions.  No lower extremity tenderness.  No edema. Neurologic:   Normal speech and language.  CN 2-10 normal. Motor grossly intact. No pronator drift.  Normal gait. No gross focal neurologic deficits are appreciated.  Skin:    Skin is warm, dry and intact. No rash noted.  No petechiae, purpura, or bullae. Psychiatric:   Mood and affect are normal. Speech and behavior are normal. Patient exhibits appropriate insight and judgment.  ____________________________________________    LABS  (pertinent positives/negatives) (all labs ordered are listed, but only abnormal results are displayed) Labs Reviewed  COMPREHENSIVE METABOLIC PANEL - Abnormal; Notable for the following:    Total Protein 8.9 (*)    All other components within normal limits  CBC WITH DIFFERENTIAL/PLATELET - Abnormal; Notable for the following:    MCV 77.6 (*)    MCH 25.1 (*)    RDW 17.7 (*)    Neutro Abs 7.5 (*)    Lymphs Abs 0.5 (*)    All  other components within normal limits  LIPASE, BLOOD  URINALYSIS COMPLETEWITH MICROSCOPIC ([facility] ONLY)   ____________________________________________   EKG    ____________________________________________    RADIOLOGY    ____________________________________________   PROCEDURES   ____________________________________________   INITIAL IMPRESSION / ASSESSMENT AND PLAN / ED COURSE  Pertinent labs & imaging results that were available during my care of the patient were reviewed by me and considered in my medical decision making (see chart for details).  Patient very well-appearing no acute distress. With the headache and low suspicion for meningitis encephalitis or intracranial sinus thrombosis. Possible UTI but most likely vomiting and diarrhea of viral origin. We'll give the patient IV fluids along with symptomatic relief while checking labs and urinalysis.  ----------------------------------------- 4:57 PM on 04/08/2015 -----------------------------------------  Vitals remained stable. Mildly tachycardic with minimal IV fluids infused so far. She is feeling better but still has the headache. Again neurologic exam is entirely unremarkable and she appears to be overall very comfortable. No neuro imaging warranted at this time. Awaiting urinalysis at which point the patient is suitable for discharge and outpatient follow-up.     ____________________________________________   FINAL CLINICAL IMPRESSION(S) / ED DIAGNOSES  Final diagnoses:   Nausea vomiting and diarrhea  Acute nonintractable headache, unspecified headache type      Sharman CheekPhillip Joeleen Wortley, MD 04/08/15 2256

## 2015-04-08 NOTE — Discharge Instructions (Signed)
General Headache Without Cause A headache is pain or discomfort felt around the head or neck area. The specific cause of a headache may not be found. There are many causes and types of headaches. A few common ones are:  Tension headaches.  Migraine headaches.  Cluster headaches.  Chronic daily headaches. HOME CARE INSTRUCTIONS  Watch your condition for any changes. Take these steps to help with your condition: Managing Pain  Take over-the-counter and prescription medicines only as told by your health care provider.  Lie down in a dark, quiet room when you have a headache.  If directed, apply ice to the head and neck area:  Put ice in a plastic bag.  Place a towel between your skin and the bag.  Leave the ice on for 20 minutes, 2-3 times per day.  Use a heating pad or hot shower to apply heat to the head and neck area as told by your health care provider.  Keep lights dim if bright lights bother you or make your headaches worse. Eating and Drinking  Eat meals on a regular schedule.  Limit alcohol use.  Decrease the amount of caffeine you drink, or stop drinking caffeine. General Instructions  Keep all follow-up visits as told by your health care provider. This is important.  Keep a headache journal to help find out what may trigger your headaches. For example, write down:  What you eat and drink.  How much sleep you get.  Any change to your diet or medicines.  Try massage or other relaxation techniques.  Limit stress.  Sit up straight, and do not tense your muscles.  Do not use tobacco products, including cigarettes, chewing tobacco, or e-cigarettes. If you need help quitting, ask your health care provider.  Exercise regularly as told by your health care provider.  Sleep on a regular schedule. Get 7-9 hours of sleep, or the amount recommended by your health care provider. SEEK MEDICAL CARE IF:   Your symptoms are not helped by medicine.  You have a  headache that is different from the usual headache.  You have nausea or you vomit.  You have a fever. SEEK IMMEDIATE MEDICAL CARE IF:   Your headache becomes severe.  You have repeated vomiting.  You have a stiff neck.  You have a loss of vision.  You have problems with speech.  You have pain in the eye or ear.  You have muscular weakness or loss of muscle control.  You lose your balance or have trouble walking.  You feel faint or pass out.  You have confusion.   This information is not intended to replace advice given to you by your health care provider. Make sure you discuss any questions you have with your health care provider.   Document Released: 03/18/2005 Document Revised: 12/07/2014 Document Reviewed: 07/11/2014 Elsevier Interactive Patient Education 2016 Else[employer] Inc[employer]  Nausea and Vomiting Nausea is a sick feeling that often comes before throwing up (vomiting). Vomiting is a reflex where stomach contents come out of your mouth. Vomiting can cause severe loss of body fluids (dehydration). Children and elderly adults can become dehydrated quickly, especially if they also have diarrhea. Nausea and vomiting are symptoms of a condition or disease. It is important to find the cause of your symptoms. CAUSES   Direct irritation of the stomach lining. This irritation can result from increased acid production (gastroesophageal reflux disease), infection, food poisoning, taking certain medicines (such as nonsteroidal anti-inflammatory drugs), alcohol use, or tobacco use.  Signals from the brain.These signals could be caused by a headache, heat exposure, an inner ear disturbance, increased pressure in the brain from injury, infection, a tumor, or a concussion, pain, emotional stimulus, or metabolic problems.  An obstruction in the gastrointestinal tract (bowel obstruction).  Illnesses such as diabetes, hepatitis, gallbladder problems, appendicitis, kidney problems, cancer,  sepsis, atypical symptoms of a heart attack, or eating disorders.  Medical treatments such as chemotherapy and radiation.  Receiving medicine that makes you sleep (general anesthetic) during surgery. DIAGNOSIS Your caregiver may ask for tests to be done if the problems do not improve after a few days. Tests may also be done if symptoms are severe or if the reason for the nausea and vomiting is not clear. Tests may include:  Urine tests.  Blood tests.  Stool tests.  Cultures (to look for evidence of infection).  X-rays or other imaging studies. Test results can help your caregiver make decisions about treatment or the need for additional tests. TREATMENT You need to stay well hydrated. Drink frequently but in small amounts.You may wish to drink water, sports drinks, clear broth, or eat frozen ice pops or gelatin dessert to help stay hydrated.When you eat, eating slowly may help prevent nausea.There are also some antinausea medicines that may help prevent nausea. HOME CARE INSTRUCTIONS   Take all medicine as directed by your caregiver.  If you do not have an appetite, do not force yourself to eat. However, you must continue to drink fluids.  If you have an appetite, eat a normal diet unless your caregiver tells you differently.  Eat a variety of complex carbohydrates (rice, wheat, potatoes, bread), lean meats, yogurt, fruits, and vegetables.  Avoid high-fat foods because they are more difficult to digest.  Drink enough water and fluids to keep your urine clear or pale yellow.  If you are dehydrated, ask your caregiver for specific rehydration instructions. Signs of dehydration may include:  Severe thirst.  Dry lips and mouth.  Dizziness.  Dark urine.  Decreasing urine frequency and amount.  Confusion.  Rapid breathing or pulse. SEEK IMMEDIATE MEDICAL CARE IF:   You have blood or brown flecks (like coffee grounds) in your vomit.  You have black or bloody  stools.  You have a severe headache or stiff neck.  You are confused.  You have severe abdominal pain.  You have chest pain or trouble breathing.  You do not urinate at least once every 8 hours.  You develop cold or clammy skin.  You continue to vomit for longer than 24 to 48 hours.  You have a fever. MAKE SURE YOU:   Understand these instructions.  Will watch your condition.  Will get help right away if you are not doing well or get worse.   This information is not intended to replace advice given to you by your health care provider. Make sure you discuss any questions you have with your health care provider.   Document Released: 03/18/2005 Document Revised: 06/10/2011 Document Reviewed: 08/15/2010 Elsevier Interactive Patient Education 2016 Elsevier Inc.  Diarrhea Diarrhea is frequent loose and watery bowel movements. It can cause you to feel weak and dehydrated. Dehydration can cause you to become tired and thirsty, have a dry mouth, and have decreased urination that often is dark yellow. Diarrhea is a sign of another problem, most often an infection that will not last long. In most cases, diarrhea typically lasts 2-3 days. However, it can last longer if it is a sign of something  more serious. It is important to treat your diarrhea as directed by your caregiver to lessen or prevent future episodes of diarrhea. CAUSES  Some common causes include:  Gastrointestinal infections caused by viruses, bacteria, or parasites.  Food poisoning or food allergies.  Certain medicines, such as antibiotics, chemotherapy, and laxatives.  Artificial sweeteners and fructose.  Digestive disorders. HOME CARE INSTRUCTIONS  Ensure adequate fluid intake (hydration): Have 1 cup (8 oz) of fluid for each diarrhea episode. Avoid fluids that contain simple sugars or sports drinks, fruit juices, whole milk products, and sodas. Your urine should be clear or pale yellow if you are drinking enough  fluids. Hydrate with an oral rehydration solution that you can purchase at pharmacies, retail stores, and online. You can prepare an oral rehydration solution at home by mixing the following ingredients together:   - tsp table salt.   tsp baking soda.   tsp salt substitute containing potassium chloride.  1  tablespoons sugar.  1 L (34 oz) of water.  Certain foods and beverages may increase the speed at which food moves through the gastrointestinal (GI) tract. These foods and beverages should be avoided and include:  Caffeinated and alcoholic beverages.  High-fiber foods, such as raw fruits and vegetables, nuts, seeds, and whole grain breads and cereals.  Foods and beverages sweetened with sugar alcohols, such as xylitol, sorbitol, and mannitol.  Some foods may be well tolerated and may help thicken stool including:  Starchy foods, such as rice, toast, pasta, low-sugar cereal, oatmeal, grits, baked potatoes, crackers, and bagels.  Bananas.  Applesauce.  Add probiotic-rich foods to help increase healthy bacteria in the GI tract, such as yogurt and fermented milk products.  Wash your hands well after each diarrhea episode.  Only take over-the-counter or prescription medicines as directed by your caregiver.  Take a warm bath to relieve any burning or pain from frequent diarrhea episodes. SEEK IMMEDIATE MEDICAL CARE IF:   You are unable to keep fluids down.  You have persistent vomiting.  You have blood in your stool, or your stools are black and tarry.  You do not urinate in 6-8 hours, or there is only a small amount of very dark urine.  You have abdominal pain that increases or localizes.  You have weakness, dizziness, confusion, or light-headedness.  You have a severe headache.  Your diarrhea gets worse or does not get better.  You have a fever or persistent symptoms for more than 2-3 days.  You have a fever and your symptoms suddenly get worse. MAKE SURE YOU:    Understand these instructions.  Will watch your condition.  Will get help right away if you are not doing well or get worse.   This information is not intended to replace advice given to you by your health care provider. Make sure you discuss any questions you have with your health care provider.   Document Released: 03/08/2002 Document Revised: 04/08/2014 Document Reviewed: 11/24/2011 Elsevier Interactive Patient Education [employer] Elsevier Inc.

## 2015-07-19 ENCOUNTER — Encounter: Payer: Self-pay | Admitting: Emergency Medicine

## 2015-07-19 DIAGNOSIS — Z3A01 Less than 8 weeks gestation of pregnancy: Secondary | ICD-10-CM | POA: Insufficient documentation

## 2015-07-19 DIAGNOSIS — O208 Other hemorrhage in early pregnancy: Secondary | ICD-10-CM | POA: Insufficient documentation

## 2015-07-19 DIAGNOSIS — R102 Pelvic and perineal pain: Secondary | ICD-10-CM | POA: Insufficient documentation

## 2015-07-19 LAB — CBC
HEMATOCRIT: 34 % — AB (ref 35.0–47.0)
HEMOGLOBIN: 11.2 g/dL — AB (ref 12.0–16.0)
MCH: 26.6 pg (ref 26.0–34.0)
MCHC: 32.9 g/dL (ref 32.0–36.0)
MCV: 80.9 fL (ref 80.0–100.0)
Platelets: 247 10*3/uL (ref 150–440)
RBC: 4.2 MIL/uL (ref 3.80–5.20)
RDW: 14.4 % (ref 11.5–14.5)
WBC: 5.6 10*3/uL (ref 3.6–11.0)

## 2015-07-19 LAB — COMPREHENSIVE METABOLIC PANEL
ALT: 9 U/L — AB (ref 14–54)
ANION GAP: 3 — AB (ref 5–15)
AST: 14 U/L — ABNORMAL LOW (ref 15–41)
Albumin: 4.2 g/dL (ref 3.5–5.0)
Alkaline Phosphatase: 64 U/L (ref 38–126)
BUN: 8 mg/dL (ref 6–20)
CHLORIDE: 106 mmol/L (ref 101–111)
CO2: 29 mmol/L (ref 22–32)
Calcium: 9.1 mg/dL (ref 8.9–10.3)
Creatinine, Ser: 0.51 mg/dL (ref 0.44–1.00)
GFR calc non Af Amer: >60 mL/min (ref >60)
Glucose, Bld: 94 mg/dL (ref 65–99)
POTASSIUM: 3.5 mmol/L (ref 3.5–5.1)
SODIUM: 138 mmol/L (ref 135–145)
Total Bilirubin: 0.2 mg/dL — ABNORMAL LOW (ref 0.3–1.2)
Total Protein: 7.4 g/dL (ref 6.5–8.1)

## 2015-07-19 LAB — URINALYSIS COMPLETE WITH MICROSCOPIC (ARMC ONLY)
BACTERIA UA: NONE SEEN
Bilirubin Urine: NEGATIVE
Glucose, UA: NEGATIVE mg/dL
HGB URINE DIPSTICK: NEGATIVE
Ketones, ur: NEGATIVE mg/dL
LEUKOCYTES UA: NEGATIVE
NITRITE: NEGATIVE
PROTEIN: 30 mg/dL — AB
SPECIFIC GRAVITY, URINE: 1.028 (ref 1.005–1.030)
pH: 8 (ref 5.0–8.0)

## 2015-07-19 LAB — HCG, QUANTITATIVE, PREGNANCY: HCG, BETA CHAIN, QUANT, S: 22535 m[IU]/mL — AB (ref <5)

## 2015-07-19 LAB — POCT PREGNANCY, URINE: Preg Test, Ur: POSITIVE — AB

## 2015-07-19 LAB — LIPASE, BLOOD: LIPASE: 19 U/L (ref 11–51)

## 2015-07-19 NOTE — ED Notes (Addendum)
Patient ambulatory to triage with steady gait, without difficulty or distress noted; pt reports odor to urine, lower abd pain and HA x 3 days; pt reports has had positive pregnancy test at home

## 2015-07-20 ENCOUNTER — Emergency Department: Payer: Self-pay

## 2015-07-20 ENCOUNTER — Emergency Department
Admission: EM | Admit: 2015-07-20 | Discharge: 2015-07-20 | Disposition: A | Discharge status: Home / Self Care | Payer: Self-pay | Attending: Emergency Medicine | Admitting: Emergency Medicine

## 2015-07-20 DIAGNOSIS — O418X10 Other specified disorders of amniotic fluid and membranes, first trimester, not applicable or unspecified: Secondary | ICD-10-CM

## 2015-07-20 DIAGNOSIS — O468X1 Other antepartum hemorrhage, first trimester: Secondary | ICD-10-CM

## 2015-07-20 DIAGNOSIS — Z3491 Encounter for supervision of normal pregnancy, unspecified, first trimester: Secondary | ICD-10-CM

## 2015-07-20 LAB — ABO/RH: ABO/RH(D): O POS

## 2015-07-20 NOTE — ED Notes (Signed)
Patient transported to Ultrasound 

## 2015-07-20 NOTE — ED Notes (Signed)
Patient requested work note and copy of positive pregnancy test results for [facility].

## 2015-07-20 NOTE — Discharge Instructions (Signed)
First Trimester of Pregnancy °The first trimester of pregnancy is from week 1 until the end of week 12 (months 1 through 3). A week after a sperm fertilizes an egg, the egg will implant on the wall of the uterus. This embryo will begin to develop into a baby. Genes from you and your partner are forming the baby. The female genes determine whether the baby is a boy or a girl. At 6-8 weeks, the eyes and face are formed, and the heartbeat can be seen on ultrasound. At the end of 12 weeks, all the baby's organs are formed.  °Now that you are pregnant, you will want to do everything you can to have a healthy baby. Two of the most important things are to get good prenatal care and to follow your health care provider's instructions. Prenatal care is all the medical care you receive before the baby's birth. This care will help prevent, find, and treat any problems during the pregnancy and childbirth. °BODY CHANGES °Your body goes through many changes during pregnancy. The changes vary from woman to woman.  °· You may gain or lose a couple of pounds at first. °· You may feel sick to your stomach (nauseous) and throw up (vomit). If the vomiting is uncontrollable, call your health care provider. °· You may tire easily. °· You may develop headaches that can be relieved by medicines approved by your health care provider. °· You may urinate more often. Painful urination may mean you have a bladder infection. °· You may develop heartburn as a result of your pregnancy. °· You may develop constipation because certain hormones are causing the muscles that push waste through your intestines to slow down. °· You may develop hemorrhoids or swollen, bulging veins (varicose veins). °· Your breasts may begin to grow larger and become tender. Your nipples may stick out more, and the tissue that surrounds them (areola) may become darker. °· Your gums may bleed and may be sensitive to brushing and flossing. °· Dark spots or blotches (chloasma,  mask of pregnancy) may develop on your face. This will likely fade after the baby is born. °· Your menstrual periods will stop. °· You may have a loss of appetite. °· You may develop cravings for certain kinds of food. °· You may have changes in your emotions from day to day, such as being excited to be pregnant or being concerned that something may go wrong with the pregnancy and baby. °· You may have more vivid and strange dreams. °· You may have changes in your hair. These can include thickening of your hair, rapid growth, and changes in texture. Some women also have hair loss during or after pregnancy, or hair that feels dry or thin. Your hair will most likely return to normal after your baby is born. °WHAT TO EXPECT AT YOUR PRENATAL VISITS °During a routine prenatal visit: °· You will be weighed to make sure you and the baby are growing normally. °· Your blood pressure will be taken. °· Your abdomen will be measured to track your baby's growth. °· The fetal heartbeat will be listened to starting around week 10 or 12 of your pregnancy. °· Test results from any previous visits will be discussed. °Your health care provider may ask you: °· How you are feeling. °· If you are feeling the baby move. °· If you have had any abnormal symptoms, such as leaking fluid, bleeding, severe headaches, or abdominal cramping. °· If you are using any tobacco products,   including cigarettes, chewing tobacco, and electronic cigarettes. °· If you have any questions. °Other tests that may be performed during your first trimester include: °· Blood tests to find your blood type and to check for the presence of any previous infections. They will also be used to check for low iron levels (anemia) and Rh antibodies. Later in the pregnancy, blood tests for diabetes will be done along with other tests if problems develop. °· Urine tests to check for infections, diabetes, or protein in the urine. °· An ultrasound to confirm the proper growth  and development of the baby. °· An amniocentesis to check for possible genetic problems. °· Fetal screens for spina bifida and Down syndrome. °· You may need other tests to make sure you and the baby are doing well. °· HIV (human immunodeficiency virus) testing. Routine prenatal testing includes screening for HIV, unless you choose not to have this test. °HOME CARE INSTRUCTIONS  °Medicines °· Follow your health care provider's instructions regarding medicine use. Specific medicines may be either safe or unsafe to take during pregnancy. °· Take your prenatal vitamins as directed. °· If you develop constipation, try taking a stool softener if your health care provider approves. °Diet °· Eat regular, well-balanced meals. Choose a variety of foods, such as meat or vegetable-based protein, fish, milk and low-fat dairy products, vegetables, fruits, and whole grain breads and cereals. Your health care provider will help you determine the amount of weight gain that is right for you. °· Avoid raw meat and uncooked cheese. These carry germs that can cause birth defects in the baby. °· Eating four or five small meals rather than three large meals a day may help relieve nausea and vomiting. If you start to feel nauseous, eating a few soda crackers can be helpful. Drinking liquids between meals instead of during meals also seems to help nausea and vomiting. °· If you develop constipation, eat more high-fiber foods, such as fresh vegetables or fruit and whole grains. Drink enough fluids to keep your urine clear or pale yellow. °Activity and Exercise °· Exercise only as directed by your health care provider. Exercising will help you: °¨ Control your weight. °¨ Stay in shape. °¨ Be prepared for labor and delivery. °· Experiencing pain or cramping in the lower abdomen or low back is a good sign that you should stop exercising. Check with your health care provider before continuing normal exercises. °· Try to avoid standing for long  periods of time. Move your legs often if you must stand in one place for a long time. °· Avoid heavy lifting. °· Wear low-heeled shoes, and practice good posture. °· You may continue to have sex unless your health care provider directs you otherwise. °Relief of Pain or Discomfort °· Wear a good support bra for breast tenderness.   °· Take warm sitz baths to soothe any pain or discomfort caused by hemorrhoids. Use hemorrhoid cream if your health care provider approves.   °· Rest with your legs elevated if you have leg cramps or low back pain. °· If you develop varicose veins in your legs, wear support hose. Elevate your feet for 15 minutes, 3-4 times a day. Limit salt in your diet. °Prenatal Care °· Schedule your prenatal visits by the twelfth week of pregnancy. They are usually scheduled monthly at first, then more often in the last 2 months before delivery. °· Write down your questions. Take them to your prenatal visits. °· Keep all your prenatal visits as directed by your   health care provider. °Safety °· Wear your seat belt at all times when driving. °· Make a list of emergency phone numbers, including numbers for family, friends, the hospital, and police and fire departments. °General Tips °· Ask your health care provider for a referral to a local prenatal education class. Begin classes no later than at the beginning of month 6 of your pregnancy. °· Ask for help if you have counseling or nutritional needs during pregnancy. Your health care provider can offer advice or refer you to specialists for help with various needs. °· Do not use hot tubs, steam rooms, or saunas. °· Do not douche or use tampons or scented sanitary pads. °· Do not cross your legs for long periods of time. °· Avoid cat litter boxes and soil used by cats. These carry germs that can cause birth defects in the baby and possibly loss of the fetus by miscarriage or stillbirth. °· Avoid all smoking, herbs, alcohol, and medicines not prescribed by  your health care provider. Chemicals in these affect the formation and growth of the baby. °· Do not use any tobacco products, including cigarettes, chewing tobacco, and electronic cigarettes. If you need help quitting, ask your health care provider. You may receive counseling support and other resources to help you quit. °· Schedule a dentist appointment. At home, brush your teeth with a soft toothbrush and be gentle when you floss. °SEEK MEDICAL CARE IF:  °· You have dizziness. °· You have mild pelvic cramps, pelvic pressure, or nagging pain in the abdominal area. °· You have persistent nausea, vomiting, or diarrhea. °· You have a bad smelling vaginal discharge. °· You have pain with urination. °· You notice increased swelling in your face, hands, legs, or ankles. °SEEK IMMEDIATE MEDICAL CARE IF:  °· You have a fever. °· You are leaking fluid from your vagina. °· You have spotting or bleeding from your vagina. °· You have severe abdominal cramping or pain. °· You have rapid weight gain or loss. °· You vomit blood or material that looks like coffee grounds. °· You are exposed to German measles and have never had them. °· You are exposed to fifth disease or chickenpox. °· You develop a severe headache. °· You have shortness of breath. °· You have any kind of trauma, such as from a fall or a car accident. °  °This information is not intended to replace advice given to you by your health care provider. Make sure you discuss any questions you have with your health care provider. °  °Document Released: 03/12/2001 Document Revised: 04/08/2014 Document Reviewed: 01/26/2013 °Elsevier Interactive Patient Education ©2016 [employer] °Subchorionic Hematoma °A subchorionic hematoma is a gathering of blood between the outer wall of the placenta and the inner wall of the womb (uterus). The placenta is the organ that connects the fetus to the wall of the uterus. The placenta performs the feeding, breathing (oxygen to the  fetus), and waste removal (excretory work) of the fetus.  °Subchorionic hematoma is the most common abnormality found on a result from ultrasonography done during the first trimester or early second trimester of pregnancy. If there has been little or no vaginal bleeding, early small hematomas usually shrink on their own and do not affect your baby or pregnancy. The blood is gradually absorbed over 1-2 weeks. When bleeding starts later in pregnancy or the hematoma is larger or occurs in an older pregnant woman, the outcome may not be as good. Larger hematomas may get bigger, which   increases the chances for miscarriage. Subchorionic hematoma also increases the risk of premature detachment of the placenta from the uterus, preterm (premature) labor, and stillbirth. °HOME CARE INSTRUCTIONS °· Stay on bed rest if your health care provider recommends this. Although bed rest will not prevent more bleeding or prevent a miscarriage, your health care provider may recommend bed rest until you are advised otherwise. °· Avoid heavy lifting (more than 10 lb [4.5 kg]), exercise, sexual intercourse, or douching as directed by your health care provider. °· Keep track of the number of pads you use each day and how soaked (saturated) they are. Write down this information. °· Do not use tampons. °· Keep all follow-up appointments as directed by your health care provider. Your health care provider may ask you to have follow-up blood tests or ultrasound tests or both. °SEEK IMMEDIATE MEDICAL CARE IF: °· You have severe cramps in your stomach, back, abdomen, or pelvis. °· You have a fever. °· You pass large clots or tissue. Save any tissue for your health care provider to look at. °· Your bleeding increases or you become lightheaded, feel weak, or have fainting episodes. °  °This information is not intended to replace advice given to you by your health care provider. Make sure you discuss any questions you have with your health care  provider. °  °Document Released: 07/03/2006 Document Revised: 04/08/2014 Document Reviewed: 10/15/2012 °Elsevier Interactive Patient Education ©2016 [employer] ° °

## 2015-07-20 NOTE — ED Provider Notes (Signed)
[facility] Emergency Department Provider Note  ____________________________________________  Time seen: 12:30 AM  I have reviewed the triage vital signs and the nursing notes.   HISTORY  Chief Complaint Abdominal Pain and Headache      HPI Krystal Richard is a 25 y.o. female G2 para 1 presents with pelvic discomfort and headache 3 days. Patient states that she's taking multiple pregnancy tests at home which were positive however she states that she's had her menses every month including last menstrual period last week.     Past Medical History  Diagnosis Date  . Sciatic pain   . SOB (shortness of breath) on exertion     Patient Active Problem List   Diagnosis Date Noted  . PROM with onset of labor within 24 hours of rupture 01/14/2015  . PROM (premature rupture of membranes) 01/14/2015  . Flu vaccine need 12/07/2014  . Leg swelling in pregnancy 11/22/2014  . Pregnancy 11/21/2014  . Supervision of normal pregnancy 09/13/2014  . Sciatic pain 09/12/2014    Past Surgical History  Procedure Laterality Date  . Eye surgery      Current Outpatient Rx  Name  Route  Sig  Dispense  Refill  . acetaminophen-codeine (TYLENOL #3) 300-30 MG tablet   Oral   Take 1-2 tablets by mouth every 4 (four) hours as needed for moderate pain.   30 tablet   0   . docusate sodium (COLACE) 100 MG capsule   Oral   Take 1 capsule (100 mg total) by mouth 2 (two) times daily as needed.   30 capsule   2   . etonogestrel-ethinyl estradiol (NUVARING) 0.12-0.015 MG/24HR vaginal ring      Insert vaginally and leave in place for 3 consecutive weeks, then remove for 1 week.   1 each   12   . ferrous sulfate 325 (65 FE) MG tablet   Oral   Take 1 tablet (325 mg total) by mouth 2 (two) times daily with a meal.   60 tablet   2   . ibuprofen (ADVIL,MOTRIN) 800 MG tablet   Oral   Take 1 tablet (800 mg total) by mouth every 8 (eight) hours as needed.   60 tablet  1   . ondansetron (ZOFRAN ODT) 8 MG disintegrating tablet   Oral   Take 1 tablet (8 mg total) by mouth every 8 (eight) hours as needed for nausea or vomiting.   20 tablet   0   . Prenatal Vit-Fe Fumarate-FA (MULTIVITAMIN-PRENATAL) 27-0.8 MG TABS tablet   Oral   Take 1 tablet by mouth daily at 12 noon.         . ranitidine (ZANTAC) 150 MG capsule   Oral   Take 1 capsule (150 mg total) by mouth 2 (two) times daily.   28 capsule   0     Allergies No known drug allergies  Family History  Problem Relation Age of Onset  . Hypertension Mother     Social History Social History  Substance Use Topics  . Smoking status: Never Smoker   . Smokeless tobacco: Never Used  . Alcohol Use: No    Review of Systems  Constitutional: Negative for fever. Eyes: Negative for visual changes. ENT: Negative for sore throat. Cardiovascular: Negative for chest pain. Respiratory: Negative for shortness of breath. Gastrointestinal: Negative for abdominal pain, vomiting and diarrhea.As a for pelvic pain Genitourinary: Negative for dysuria. Musculoskeletal: Negative for back pain. Skin: Negative for rash. Neurological: Negative for  headaches, focal weakness or numbness.   10-point ROS otherwise negative.  ____________________________________________   PHYSICAL EXAM:  VITAL SIGNS: ED Triage Vitals  Enc Vitals Group     BP 07/19/15 2146 126/63 mmHg     Pulse Rate 07/19/15 2146 86     Resp 07/19/15 2146 20     Temp 07/19/15 2146 98.6 F (37 C)     Temp Source 07/19/15 2146 Oral     SpO2 07/19/15 2146 100 %     Weight 07/19/15 2146 150 lb (68.04 kg)     Height 07/19/15 2146  (1.676 m)     Head Cir --      Peak Flow --      Pain Score 07/19/15 2146 9     Pain Loc --      Pain Edu? --      Excl. in GC? --      Constitutional: Alert and oriented. Well appearing and in no distress. Eyes: Conjunctivae are normal. PERRL. Normal extraocular movements. ENT   Head:  Normocephalic and atraumatic.   Nose: No congestion/rhinnorhea.   Mouth/Throat: Mucous membranes are moist.   Neck: No stridor. Hematological/Lymphatic/Immunilogical: No cervical lymphadenopathy. Cardiovascular: Normal rate, regular rhythm. Normal and symmetric distal pulses are present in all extremities. No murmurs, rubs, or gallops. Respiratory: Normal respiratory effort without tachypnea nor retractions. Breath sounds are clear and equal bilaterally. No wheezes/rales/rhonchi. Gastrointestinal: Soft and nontender. No distention. There is no CVA tenderness. Genitourinary: deferred Musculoskeletal: Nontender with normal range of motion in all extremities. No joint effusions.  No lower extremity tenderness nor edema. Neurologic:  Normal speech and language. No gross focal neurologic deficits are appreciated. Speech is normal.  Skin:  Skin is warm, dry and intact. No rash noted. Psychiatric: Mood and affect are normal. Speech and behavior are normal. Patient exhibits appropriate insight and judgment.  ____________________________________________    LABS (pertinent positives/negatives)  Labs Reviewed  COMPREHENSIVE METABOLIC PANEL - Abnormal; Notable for the following:    AST 14 (*)    ALT 9 (*)    Total Bilirubin 0.2 (*)    Anion gap 3 (*)    All other components within normal limits  CBC - Abnormal; Notable for the following:    Hemoglobin 11.2 (*)    HCT 34.0 (*)    All other components within normal limits  URINALYSIS COMPLETEWITH MICROSCOPIC ([facility] ONLY) - Abnormal; Notable for the following:    Color, Urine YELLOW (*)    APPearance CLEAR (*)    Protein, ur 30 (*)    Squamous Epithelial / LPF 0-5 (*)    All other components within normal limits  HCG, QUANTITATIVE, PREGNANCY - Abnormal; Notable for the following:    hCG, Beta Chain, Quant, S 22535 (*)    All other components within normal limits  POCT PREGNANCY, URINE - Abnormal; Notable for the following:    Preg  Test, Ur POSITIVE (*)    All other components within normal limits  LIPASE, BLOOD  ABO/RH       RADIOLOGY US Ob Transvaginal (Final result) Result time: 07/20/15 03:15:16   Final result by Rad Results In Interface (07/20/15 03:15:16)   Narrative:   CLINICAL DATA: Pregnant patient in first-trimester pregnancy with pelvic pain. Pain for 4 days. Beta HCG 22535  EXAM: OBSTETRIC <14 WK [place] AND TRANSVAGINAL OB US  TECHNIQUE: Both transabdominal and transvaginal ultrasound examinations were performed for complete evaluation of the gestation as well as the maternal uterus, adnexal regions,  and pelvic cul-de-sac. Transvaginal technique was performed to assess early pregnancy.  COMPARISON: None.  FINDINGS: Intrauterine gestational sac: Present.  Yolk sac: Present.  Embryo: Not present.  Cardiac Activity: Not present.  MSD: 8.4 mm  5 w  4 d  Subchorionic hemorrhage: Small, measures 1.7 x 0.4 cm.  Maternal uterus/adnexae: Both ovaries are identified and normal. No pelvic free fluid.  IMPRESSION: Intrauterine gestational sac at 5 week 4 day by mean sac diameter containing yolk sac, however no fetal pole or cardiac activity. Small subchorionic hemorrhage. Recommend trending of beta HCG. Sonographic follow-up recommended in 10 days as indicated.   Electronically Signed By: Rubye OaksMelanie Ehinger M.D. On: 07/20/2015 03:15          US OB Comp Less 14 Wks (Final result) Result time: 07/20/15 03:15:16   Final result by Rad Results In Interface (07/20/15 03:15:16)   Narrative:   CLINICAL DATA: Pregnant patient in first-trimester pregnancy with pelvic pain. Pain for 4 days. Beta HCG 22535  EXAM: OBSTETRIC <14 WK US AND TRANSVAGINAL OB US  TECHNIQUE: Both transabdominal and transvaginal ultrasound examinations were performed for complete evaluation of the gestation as well as the maternal uterus, adnexal regions, and pelvic cul-de-sac. Transvaginal  technique was performed to assess early pregnancy.  COMPARISON: None.  FINDINGS: Intrauterine gestational sac: Present.  Yolk sac: Present.  Embryo: Not present.  Cardiac Activity: Not present.  MSD: 8.4 mm  5 w  4 d  Subchorionic hemorrhage: Small, measures 1.7 x 0.4 cm.  Maternal uterus/adnexae: Both ovaries are identified and normal. No pelvic free fluid.  IMPRESSION: Intrauterine gestational sac at 5 week 4 day by mean sac diameter containing yolk sac, however no fetal pole or cardiac activity. Small subchorionic hemorrhage. Recommend trending of beta HCG. Sonographic follow-up recommended in 10 days as indicated.   Electronically Signed By: Rubye OaksMelanie Ehinger M.D. On: 07/20/2015 03:15           INITIAL IMPRESSION / ASSESSMENT AND PLAN / ED COURSE  Pertinent labs & imaging results that were available during my care of the patient were reviewed by me and considered in my medical decision making (see chart for details).  Patient referred to OB/GYN on call for further outpatient evaluation and management.  ____________________________________________   FINAL CLINICAL IMPRESSION(S) / ED DIAGNOSES  Final diagnoses:  First trimester pregnancy  Subchorionic bleed, first trimester      Darci Currentandolph N Brown, MD 07/20/15 [phone]

## 2015-08-17 ENCOUNTER — Other Ambulatory Visit: Payer: Self-pay | Admitting: Obstetrics and Gynecology

## 2015-08-17 DIAGNOSIS — N63 Unspecified lump in unspecified breast: Secondary | ICD-10-CM

## 2015-08-17 LAB — OB RESULTS CONSOLE HEPATITIS B SURFACE ANTIGEN: HEP B S AG: NEGATIVE

## 2015-08-17 LAB — OB RESULTS CONSOLE VARICELLA ZOSTER ANTIBODY, IGG: Varicella: IMMUNE

## 2015-08-17 LAB — OB RESULTS CONSOLE GC/CHLAMYDIA
CHLAMYDIA, DNA PROBE: NEGATIVE
GC PROBE AMP, GENITAL: NEGATIVE

## 2015-08-17 LAB — OB RESULTS CONSOLE RPR: RPR: NONREACTIVE

## 2015-08-17 LAB — OB RESULTS CONSOLE RUBELLA ANTIBODY, IGM: Rubella: IMMUNE

## 2015-08-18 ENCOUNTER — Other Ambulatory Visit: Payer: Self-pay | Admitting: Obstetrics and Gynecology

## 2015-08-18 DIAGNOSIS — Z369 Encounter for antenatal screening, unspecified: Secondary | ICD-10-CM

## 2015-08-22 ENCOUNTER — Ambulatory Visit
Admission: RE | Admit: 2015-08-22 | Discharge: 2015-08-22 | Disposition: A | Discharge status: Home / Self Care | Payer: Medicaid Other | Source: Ambulatory Visit | Attending: Obstetrics and Gynecology | Admitting: Obstetrics and Gynecology

## 2015-08-22 DIAGNOSIS — N63 Unspecified lump in unspecified breast: Secondary | ICD-10-CM

## 2015-08-22 HISTORY — DX: Unspecified lump in unspecified breast: N63.0

## 2015-08-29 ENCOUNTER — Encounter: Payer: Medicaid Other | Admitting: Obstetrics and Gynecology

## 2015-09-07 ENCOUNTER — Ambulatory Visit (HOSPITAL_BASED_OUTPATIENT_CLINIC_OR_DEPARTMENT_OTHER)
Admission: RE | Admit: 2015-09-07 | Discharge: 2015-09-07 | Disposition: A | Discharge status: Home / Self Care | Payer: Medicaid Other | Source: Ambulatory Visit | Attending: Obstetrics and Gynecology | Admitting: Obstetrics and Gynecology

## 2015-09-07 ENCOUNTER — Ambulatory Visit
Admission: RE | Admit: 2015-09-07 | Discharge: 2015-09-07 | Disposition: A | Discharge status: Home / Self Care | Payer: Medicaid Other | Source: Ambulatory Visit | Attending: Obstetrics and Gynecology | Admitting: Obstetrics and Gynecology

## 2015-09-07 VITALS — BP 111/81 | HR 101 | Temp 98.3°F | Resp 18 | Wt 138.8 lb

## 2015-09-07 DIAGNOSIS — Z369 Encounter for antenatal screening, unspecified: Secondary | ICD-10-CM

## 2015-09-07 DIAGNOSIS — Z36 Encounter for antenatal screening of mother: Secondary | ICD-10-CM | POA: Diagnosis not present

## 2015-09-07 DIAGNOSIS — Z3A13 13 weeks gestation of pregnancy: Secondary | ICD-10-CM | POA: Diagnosis not present

## 2015-09-07 DIAGNOSIS — O09211 Supervision of pregnancy with history of pre-term labor, first trimester: Secondary | ICD-10-CM | POA: Diagnosis present

## 2015-09-07 NOTE — Progress Notes (Addendum)
Referring physician:  [facility] Length of Consultation: 30 minutes   [name]  was referred to [facility] for genetic counseling to review prenatal screening and testing options.  This note summarizes the information we discussed.    We offered the following routine screening tests for this pregnancy:  First trimester screening, which includes nuchal translucency ultrasound screen and first trimester maternal serum marker screening.  The nuchal translucency has approximately an 80% detection rate for Down syndrome and can be positive for other chromosome abnormalities as well as congenital heart defects.  When combined with a maternal serum marker screening, the detection rate is up to 90% for Down syndrome and up to 97% for trisomy 18.     Maternal serum marker screening, a blood test that measures pregnancy proteins, can provide risk assessments for Down syndrome, trisomy 18, and open neural tube defects (spina bifida, anencephaly). Because it does not directly examine the fetus, it cannot positively diagnose or rule out these problems.  Targeted ultrasound uses high frequency sound waves to create an image of the developing fetus.  An ultrasound is often recommended as a routine means of evaluating the pregnancy.  It is also used to screen for fetal anatomy problems (for example, a heart defect) that might be suggestive of a chromosomal or other abnormality.   Should these screening tests indicate an increased concern, then the following additional testing options would be offered:  The chorionic villus sampling procedure is available for first trimester chromosome analysis.  This involves the withdrawal of a small amount of chorionic villi (tissue from the developing placenta).  Risk of pregnancy loss is estimated to be approximately 1 in 200 to 1 in 100 (0.5 to 1%).  There is approximately a 1% (1 in 100) chance that the CVS chromosome results will be unclear.   Chorionic villi cannot be tested for neural tube defects.     Amniocentesis involves the removal of a small amount of amniotic fluid from the sac surrounding the fetus with the use of a thin needle inserted through the maternal abdomen and uterus.  Ultrasound guidance is used throughout the procedure.  Fetal cells from amniotic fluid are directly evaluated and > 99.5% of chromosome problems and > 98% of open neural tube defects can be detected. This procedure is generally performed after the 15th week of pregnancy.  The main risks to this procedure include complications leading to miscarriage in less than 1 in 200 cases (0.5%).  As another option for information if the pregnancy is suspected to be an an increased chance for certain chromosome conditions, we also reviewed the availability of cell free fetal DNA testing from maternal blood to determine whether or not the baby may have either Down syndrome, trisomy 50, or trisomy 2.  This test utilizes a maternal blood sample and DNA sequencing technology to isolate circulating cell free fetal DNA from maternal plasma.  The fetal DNA can then be analyzed for DNA sequences that are derived from the three most common chromosomes involved in aneuploidy, chromosomes 13, 18, and 21.  If the overall amount of DNA is greater than the expected level for any of these chromosomes, aneuploidy is suspected.  While we do not consider it a replacement for invasive testing and karyotype analysis, a negative result from this testing would be reassuring, though not a guarantee of a normal chromosome complement for the baby.  An abnormal result is certainly suggestive of an abnormal chromosome complement, though we  would still recommend CVS or amniocentesis to confirm any findings from this testing.  Based upon recent guidelines, Cystic Fibrosis and Spinal Muscular Atrophy (SMA) screening were also discussed with the patient. Both conditions are recessive, which means that both  parents must be carriers in order to have a child with the disease.  Cystic fibrosis (CF) is one of the most common genetic conditions in persons of Caucasian ancestry.  This condition occurs in approximately 1 in 2,500 Caucasian persons and results in thickened secretions in the lungs, digestive, and reproductive systems.  For a baby to be at risk for having CF, both of the parents must be carriers for this condition.  Approximately 1 in 60 Caucasian persons is a carrier for CF, while 1 in 11 persons of African American background will be a carrier.  Current carrier testing looks for the most common mutations in the gene for CF and can detect approximately 90% of carriers in the Caucasian population and 81% of carriers in the [place] American population.  This means that the carrier screening can greatly reduce, but cannot eliminate, the chance for an individual to have a child with CF.  If an individual is found to be a carrier for CF, then carrier testing would be available for the partner. As part of Krystal Richard's newborn screening profile, all babies born in the state of [place] will have a two-tier screening process.  Specimens are first tested to determine the concentration of immunoreactive trypsinogen (IRT).  The top 5% of specimens with the highest IRT values then undergo DNA testing using a panel of over 40 common CF mutations. SMA is a neurodegenerative disorder that leads to atrophy of skeletal muscle and overall weakness.  This condition is also more prevalent in the Caucasian population, with 1 in 40-1 in 60 persons being a carrier and 1 in 6,000-1 in 10,000 children being affected.  For those of African American background, the carrier frequency is 1 in 64 and the disease occurs in approximately 1 in 20,000 children.  There are multiple forms of the disease, with some causing death in infancy to other forms with survival into adulthood.  The genetics of SMA is complex, but carrier screening  can detect up to 95% of carriers in the Caucasian population and 70% in the African American population.  Similar to CF, a negative result can greatly reduce, but cannot eliminate, the chance to have a child with SMA.  A review of Krystal Richard' prenatal labs showed normal screening for hemoglobinpathies.  She has an MCV of 82 and a normal hemoglobin fractionation (AA).  We obtained a detailed family history and pregnancy history.  This is the third pregnancy for this couple.  They have healthy 24 year old and 54 month old daughters.  The father of the baby reported on paternal aunt with albinism. We discussed that there may be different forms of albinism most of which are inherited as a recessive condition.  This means that both parents of an individual with albinism are carriers for a genetic change and when the child inherits both copies of that changed gene, they have the symptoms of the condition.  Given the history, there is a 1/3 chance for the father of the baby to be a carrier, but Krystal Richard did not report a family history of albinism so we would expect the chance for her to be a carrier is low.  They were not interested in a review of medical records or consideration  of any testing due to this history.  The remainder of the family history was reported to be unremarkable for birth defects, mental retardation, recurrent pregnancy loss or known chromosome abnormalities.  Krystal Richard reported no complications or exposures in this pregnancy that would be expected to increase the risk for birth defects.    After consideration of the options, Krystal Richard elected to proceed with first trimester screening.  She declined CF and SMA carrier testing.  An ultrasound was performed at the time of the visit.  Fetal anatomy could not be assessed due to early gestational age.  Please refer to the ultrasound report for details of that study.  Krystal Richard was encouraged to call with questions or concerns.  We can be  contacted at [phone].  Krystal Andersoneborah F. Wells, MS, CGC  I saw the patient and reviewed the u/s findings and genetic testing Agree with plan  Jimmey RalphLivingston, Krystal Richard

## 2015-09-14 ENCOUNTER — Telehealth: Payer: Self-pay | Admitting: Obstetrics and Gynecology

## 2015-09-14 NOTE — Telephone Encounter (Signed)
The results of the First Trimester Nuchal Translucency and Biochemical Screening are now available.  These results showed an increased risk for Down Syndrome.  Specifically, the risk for Down syndrome is now 1 in 3473 (the prior risk based upon age alone was 1 in 1,026).  The risk for Trisomy 13/18 is estimated to be less than 1 in 10,000.  As we discussed when we called with these results, if more definitive information is desired, we would offer chorionic villus sampling or amniocentesis.  Because we do not yet know the effectiveness of combined first and second trimester screening, we do not recommend a maternal serum screen to assess the chance for chromosome conditions.  However, if screening for neural tube defects is desired, maternal serum screening for AFP only can be performed between 15 and [redacted] weeks gestation.    We also reviewed the availability of cell free fetal DNA testing from maternal blood to determine whether or not the baby may have either Down syndrome, trisomy 6113, or trisomy 7018.  This test utilizes a maternal blood sample and DNA sequencing technology to isolate circulating cell free fetal DNA from maternal plasma.  The fetal DNA can then be analyzed for DNA sequences that are derived from the three most common chromosomes involved in aneuploidy, chromosomes 13, 18, and 21.  If the overall amount of DNA is greater than the expected level for any of these chromosomes, aneuploidy is suspected.  While we do not consider it a replacement for invasive testing and karyotype analysis, a negative result from this testing would be reassuring, though not a guarantee of a normal chromosome complement for the baby.  An abnormal result is certainly suggestive of an abnormal chromosome complement, though we would still recommend CVS or amniocentesis to confirm any findings from this testing  The patient elected to return for additional genetic counseling on Monday, June 19, as she was out of town on  vacation at the time of our call.  The patient was encouraged to contact us with any questions.  We may be reached at [phone].  Cherly Andersoneborah F. Ivie Savitt, MS, CGC

## 2015-09-18 ENCOUNTER — Telehealth: Payer: Self-pay | Admitting: Obstetrics and Gynecology

## 2015-09-18 NOTE — Telephone Encounter (Signed)
[name] did not show for genetic counseling visit today to review abnormal results of first trimester screening which showed a 1 in 8873 chance for Down syndrome.  We left a voicemail for her to contact our office regarding this appointment.  We are happy to reschedule this visit if she would like to speak about additional screening and testing options for this pregnancy.  We may be reached at [phone].  Cherly Andersoneborah F. Caytlyn Evers, MS, CGC

## 2015-12-23 ENCOUNTER — Inpatient Hospital Stay
Admission: EM | Admit: 2015-12-23 | Discharge: 2015-12-23 | Disposition: A | Discharge status: Home / Self Care | Payer: Medicaid Other | Attending: Obstetrics & Gynecology | Admitting: Obstetrics & Gynecology

## 2015-12-23 ENCOUNTER — Encounter: Payer: Self-pay | Admitting: *Deleted

## 2015-12-23 DIAGNOSIS — R55 Syncope and collapse: Secondary | ICD-10-CM

## 2015-12-23 DIAGNOSIS — O99013 Anemia complicating pregnancy, third trimester: Secondary | ICD-10-CM | POA: Insufficient documentation

## 2015-12-23 DIAGNOSIS — O9989 Other specified diseases and conditions complicating pregnancy, childbirth and the puerperium: Secondary | ICD-10-CM | POA: Insufficient documentation

## 2015-12-23 DIAGNOSIS — Z3A28 28 weeks gestation of pregnancy: Secondary | ICD-10-CM | POA: Insufficient documentation

## 2015-12-23 DIAGNOSIS — O99019 Anemia complicating pregnancy, unspecified trimester: Secondary | ICD-10-CM

## 2015-12-23 LAB — URINALYSIS COMPLETE WITH MICROSCOPIC (ARMC ONLY)
BACTERIA UA: NONE SEEN
Bilirubin Urine: NEGATIVE
Glucose, UA: NEGATIVE mg/dL
HGB URINE DIPSTICK: NEGATIVE
Ketones, ur: NEGATIVE mg/dL
Leukocytes, UA: NEGATIVE
Nitrite: NEGATIVE
PH: 6 (ref 5.0–8.0)
PROTEIN: 30 mg/dL — AB
Specific Gravity, Urine: 1.026 (ref 1.005–1.030)

## 2015-12-23 LAB — COMPREHENSIVE METABOLIC PANEL
ALBUMIN: 3.2 g/dL — AB (ref 3.5–5.0)
ALT: 9 U/L — ABNORMAL LOW (ref 14–54)
ANION GAP: 5 (ref 5–15)
AST: 15 U/L (ref 15–41)
Alkaline Phosphatase: 104 U/L (ref 38–126)
BILIRUBIN TOTAL: 0.2 mg/dL — AB (ref 0.3–1.2)
BUN: 6 mg/dL (ref 6–20)
CHLORIDE: 101 mmol/L (ref 101–111)
CO2: 26 mmol/L (ref 22–32)
Calcium: 8.8 mg/dL — ABNORMAL LOW (ref 8.9–10.3)
Creatinine, Ser: 0.4 mg/dL — ABNORMAL LOW (ref 0.44–1.00)
GFR calc Af Amer: >60 mL/min (ref >60)
GFR calc non Af Amer: >60 mL/min (ref >60)
GLUCOSE: 100 mg/dL — AB (ref 65–99)
POTASSIUM: 3.4 mmol/L — AB (ref 3.5–5.1)
SODIUM: 132 mmol/L — AB (ref 135–145)
TOTAL PROTEIN: 7.4 g/dL (ref 6.5–8.1)

## 2015-12-23 MED ORDER — LACTATED RINGERS IV BOLUS (SEPSIS)
1000.0000 mL | Freq: Once | INTRAVENOUS | Status: AC
Start: 2015-12-23 — End: 2015-12-23
  Administered 2015-12-23: 1000 mL via INTRAVENOUS

## 2015-12-23 NOTE — Progress Notes (Signed)
Patient given discharge instructions including follow up information and preterm labor precautions, and fetal kick count instructions.  Pt. Verbalized understanding. Pt. Transported via wheelchair accompanied by K. Loretta Kluender RN to visitor entry in stable condition. Patient transported home in private vehicle driven by friend.

## 2015-12-23 NOTE — OB Triage Provider Note (Signed)
Triage Note:  History of Present Illness: Krystal Richard is a 25 y.o. G3P2002 at [redacted]w[redacted]d by 13+2 week ultrasound, presenting today for a syncopal episode today around 1715.  She was working today at [employer], standing all day, from 8-4:45 pm.  She states she had a break around 3pm to have a snack and reports drinking water all day.  She states she walked down the street to the store, and was walking around the store and went up to the front and felt weak and lightheaded.  She states the next thing she remembers, she was on the ground and the employee states she got weak at the knees and slid down on her back.  She was brought by EMS.  She states she has been seeing spots today when she looks at the lights, but denies hitting her abdomen or head during the fall.  She also reports some chest tightness since last night.  She reports receiving the flu and Tdap on Monday 12/18/15, and has had cold symptoms and sinus congestion since with watery right eye.  She has only been taking Tylenol.  She endorses good fetal movement and reports BH ctxs.  She is on day 3 of Terazol 7 cream for a yeast infection.  She denies LOF, VB, abnormal discharge, dysuria, CP, heart palpitations or SOB.  She has known anemia in pregnancy and is taking FE BID.     Patient Active Problem List   Diagnosis Date Noted  . Syncope 12/23/2015  . Anemia in pregnancy 12/23/2015  . First trimester screening 09/07/2015  . Flu vaccine need 12/07/2014  . Leg swelling in pregnancy 11/22/2014  . Pregnancy 11/21/2014  . Supervision of normal pregnancy 09/13/2014  . Sciatic pain 09/12/2014    Past Medical History:  Diagnosis Date  . Breast mass   . Sciatic pain   . SOB (shortness of breath) on exertion     Past Surgical History:  Procedure Laterality Date  . EYE SURGERY      OB History  Gravida Para Term Preterm AB Living  3 2 2     2   SAB TAB Ectopic Multiple Live Births        0 2    # Outcome Date GA Lbr Len/2nd Weight Sex  Delivery Anes PTL Lv  3 Current           2 Term 01/15/15 [redacted]w[redacted]d 17:55 / 00:06 2.82 kg (6 lb 3.5 oz) F Vag-Spont EPI  LIV  1 Term 2014 [redacted]w[redacted]d  3.005 kg (6 lb 10 oz) F Vag-Spont  N LIV      Social History   Social History  . Marital status: Married    Spouse name: N/A  . Number of children: N/A  . Years of education: N/A   Social History Main Topics  . Smoking status: Never Smoker  . Smokeless tobacco: Never Used  . Alcohol use No  . Drug use: No  . Sexual activity: Yes    Birth control/ protection: None     Comment: Pregnant   Other Topics Concern  . None   Social History Narrative  . None    Family History  Problem Relation Age of Onset  . Hypertension Mother     No Known Allergies  Prescriptions Prior to Admission  Medication Sig Dispense Refill Last Dose  . ferrous sulfate 325 (65 FE) MG tablet Take 1 tablet (325 mg total) by mouth 2 (two) times daily with a meal. 60 tablet 2  12/23/2015 at Unknown time  . Prenatal Vit-Fe Fumarate-FA (MULTIVITAMIN-PRENATAL) 27-0.8 MG TABS tablet Take 1 tablet by mouth daily at 12 noon.   12/23/2015 at Unknown time  . acetaminophen-codeine (TYLENOL #3) 300-30 MG tablet Take 1-2 tablets by mouth every 4 (four) hours as needed for moderate pain. (Patient not taking: Reported on 12/23/2015) 30 tablet 0 Not Taking at Unknown time  . docusate sodium (COLACE) 100 MG capsule Take 1 capsule (100 mg total) by mouth 2 (two) times daily as needed. (Patient not taking: Reported on 12/23/2015) 30 capsule 2 Not Taking at Unknown time  . etonogestrel-ethinyl estradiol (NUVARING) 0.12-0.015 MG/24HR vaginal ring Insert vaginally and leave in place for 3 consecutive weeks, then remove for 1 week. (Patient not taking: Reported on 12/23/2015) 1 each 12 Not Taking at Unknown time  . ibuprofen (ADVIL,MOTRIN) 800 MG tablet Take 1 tablet (800 mg total) by mouth every 8 (eight) hours as needed. (Patient not taking: Reported on 12/23/2015) 60 tablet 1 Not Taking at  Unknown time  . ondansetron (ZOFRAN ODT) 8 MG disintegrating tablet Take 1 tablet (8 mg total) by mouth every 8 (eight) hours as needed for nausea or vomiting. (Patient not taking: Reported on 12/23/2015) 20 tablet 0 Not Taking at Unknown time  . ranitidine (ZANTAC) 150 MG capsule Take 1 capsule (150 mg total) by mouth 2 (two) times daily. (Patient not taking: Reported on 12/23/2015) 28 capsule 0 Not Taking at Unknown time    Review of Systems - History obtained from chart review and the patient General ROS: negative for - chills, fatigue or fever Psychological ROS: negative ENT ROS: positive for - nasal congestion, sinus pain, visual changes and Seeing spots "like nets" around the lights Hematological and Lymphatic ROS: negative Endocrine ROS: negative Respiratory ROS: no cough, shortness of breath, or wheezing positive for - congestion Cardiovascular ROS: no chest pain or dyspnea on exertion negative for - palpitations Gastrointestinal ROS: no abdominal pain, change in bowel habits, or black or bloody stools Genito-Urinary ROS: no dysuria, trouble voiding, or hematuria Musculoskeletal ROS: positive for - muscular weakness Neurological ROS: positive for - dizziness, visual changes and weakness Dermatological ROS: negative  +good fetal movement, +BH ctxs  Vitals:  BP (!) 113/51   Pulse (!) 109   Temp 97.9 F (36.6 C) (Oral)   Resp 14   LMP 06/02/2015 (Approximate)  Physical Examination: CONSTITUTIONAL: Well-developed, well-nourished female in no acute distress.  HENT:  Normocephalic, atraumatic, External right and left ear normal. Oropharynx is clear and moist EYES: Conjunctivae and EOM are normal. Pupils are equal, round, and reactive to light. No scleral icterus.  NECK: Normal range of motion, supple, no masses SKIN: Skin is warm and dry. No rash noted. Not diaphoretic. No erythema. No pallor. NEUROLGIC: Alert and oriented to person, place, and time. Normal reflexes, muscle tone  coordination. No cranial nerve deficit noted. PSYCHIATRIC: Normal mood and affect. Normal behavior. Normal judgment and thought content. CARDIOVASCULAR: Normal heart rate noted, regular rhythm RESPIRATORY: Effort and breath sounds normal, no problems with respiration noted ABDOMEN: Soft, nontender, nondistended, gravid. MUSCULOSKELETAL: Normal range of motion. No edema and no tenderness. 2+ distal pulses.  Cervix: Deferred Membranes:intact Fetal Monitoring: Baseline: 135 bpm/ moderate variability/ + accels/ no decels Tocometer: Flat  Labs:  Results for orders placed or performed during the hospital encounter of 12/23/15 (from the past 24 hour(s))  Urinalysis complete, with microscopic [facility] only)   Collection Time: 12/23/15  6:50 PM  Result Value Ref Range  Color, Urine YELLOW (A) YELLOW   APPearance CLEAR (A) CLEAR   Glucose, UA NEGATIVE NEGATIVE mg/dL   Bilirubin Urine NEGATIVE NEGATIVE   Ketones, ur NEGATIVE NEGATIVE mg/dL   Specific Gravity, Urine 1.026 1.005 - 1.030   Hgb urine dipstick NEGATIVE NEGATIVE   pH 6.0 5.0 - 8.0   Protein, ur 30 (A) NEGATIVE mg/dL   Nitrite NEGATIVE NEGATIVE   Leukocytes, UA NEGATIVE NEGATIVE   RBC / HPF 0-5 0 - 5 RBC/hpf   WBC, UA 0-5 0 - 5 WBC/hpf   Bacteria, UA NONE SEEN NONE SEEN   Squamous Epithelial / LPF 0-5 (A) NONE SEEN   Mucous PRESENT   Comprehensive metabolic panel   Collection Time: 12/23/15  8:03 PM  Result Value Ref Range   Sodium 132 (L) 135 - 145 mmol/L   Potassium 3.4 (L) 3.5 - 5.1 mmol/L   Chloride 101 101 - 111 mmol/L   CO2 26 22 - 32 mmol/L   Glucose, Bld 100 (H) 65 - 99 mg/dL   BUN 6 6 - 20 mg/dL   Creatinine, Ser 3.08 (L) 0.44 - 1.00 mg/dL   Calcium 8.8 (L) 8.9 - 10.3 mg/dL   Total Protein 7.4 6.5 - 8.1 g/dL   Albumin 3.2 (L) 3.5 - 5.0 g/dL   AST 15 15 - 41 U/L   ALT 9 (L) 14 - 54 U/L   Alkaline Phosphatase 104 38 - 126 U/L   Total Bilirubin 0.2 (L) 0.3 - 1.2 mg/dL   GFR calc non Af Amer >60 >60 mL/min   GFR  calc Af Amer >60 >60 mL/min   Anion gap 5 5 - 15    Imaging Studies:  EKG performed: NSR   Assessment and Plan: 1. Syncopal Episode      - UA, CMP, EKG     - Continuous monitoring      - Insert PIV, LR Bolus liter over 2 hours  2. Anemia in Pregnancy      - Continue FE BID  Dr. Elesa Massed notified aware and agrees with plan   Reassessment:  Feeling much better after IVF - vision changes resolved, BP WNL, HR WNL Category 1 FHR tracing Common Cold - advised Sudafed, saline nasal spray, or Mucinex or Robitussin - cautioned to only choose one medication to take Advised the watery eyes may be related to allergies and Claritin and Zyrtec were safe  Fetal kick counts daily Preterm labor and warning s/s reviewed  Mild hyponatremia and hypokalemia  Increase in small frequent meals, recommend increase in sodium and potassium.  EKG WNL D/C Home  Note written to be out of work Saturday-Tuesday for supportive care F/U in office on Tuesday   Carlean Jews, [place]

## 2015-12-23 NOTE — Progress Notes (Signed)
RT at bedside for EKG.

## 2015-12-23 NOTE — Discharge Instructions (Signed)
Safe cold medications to use in pregnancy:  Mucinex  Saline nasal spray Sudafed  Robitussin   Fetal Movement Counts Patient Name: __________________________________________________ Patient Due Date: ____________________ Performing a fetal movement count is highly recommended in high-risk pregnancies, but it is good for every pregnant woman to do. Your health care provider may ask you to start counting fetal movements at 28 weeks of the pregnancy. Fetal movements often increase:  After eating a full meal.  After physical activity.  After eating or drinking something sweet or cold.  At rest. Pay attention to when you feel the baby is most active. This will help you notice a pattern of your baby's sleep and wake cycles and what factors contribute to an increase in fetal movement. It is important to perform a fetal movement count at the same time each day when your baby is normally most active.  HOW TO COUNT FETAL MOVEMENTS 1. Find a quiet and comfortable area to sit or lie down on your left side. Lying on your left side provides the best blood and oxygen circulation to your baby. 2. Write down the day and time on a sheet of paper or in a journal. 3. Start counting kicks, flutters, swishes, rolls, or jabs in a 2-hour period. You should feel at least 10 movements within 2 hours. 4. If you do not feel 10 movements in 2 hours, wait 2-3 hours and count again. Look for a change in the pattern or not enough counts in 2 hours. SEEK MEDICAL CARE IF:  You feel less than 10 counts in 2 hours, tried twice.  There is no movement in over an hour.  The pattern is changing or taking longer each day to reach 10 counts in 2 hours.  You feel the baby is not moving as he or she usually does. Date: ____________ Movements: ____________ Start time: ____________ Doreatha MartinFinish time: ____________  Date: ____________ Movements: ____________ Start time: ____________ Doreatha MartinFinish time: ____________ Date: ____________  Movements: ____________ Start time: ____________ Doreatha MartinFinish time: ____________ Date: ____________ Movements: ____________ Start time: ____________ Doreatha MartinFinish time: ____________ Date: ____________ Movements: ____________ Start time: ____________ Doreatha MartinFinish time: ____________ Date: ____________ Movements: ____________ Start time: ____________ Doreatha MartinFinish time: ____________ Date: ____________ Movements: ____________ Start time: ____________ Doreatha MartinFinish time: ____________ Date: ____________ Movements: ____________ Start time: ____________ Doreatha MartinFinish time: ____________  Date: ____________ Movements: ____________ Start time: ____________ Doreatha MartinFinish time: ____________ Date: ____________ Movements: ____________ Start time: ____________ Doreatha MartinFinish time: ____________ Date: ____________ Movements: ____________ Start time: ____________ Doreatha MartinFinish time: ____________ Date: ____________ Movements: ____________ Start time: ____________ Doreatha MartinFinish time: ____________ Date: ____________ Movements: ____________ Start time: ____________ Doreatha MartinFinish time: ____________ Date: ____________ Movements: ____________ Start time: ____________ Doreatha MartinFinish time: ____________ Date: ____________ Movements: ____________ Start time: ____________ Doreatha MartinFinish time: ____________  Date: ____________ Movements: ____________ Start time: ____________ Doreatha MartinFinish time: ____________ Date: ____________ Movements: ____________ Start time: ____________ Doreatha MartinFinish time: ____________ Date: ____________ Movements: ____________ Start time: ____________ Doreatha MartinFinish time: ____________ Date: ____________ Movements: ____________ Start time: ____________ Doreatha MartinFinish time: ____________ Date: ____________ Movements: ____________ Start time: ____________ Doreatha MartinFinish time: ____________ Date: ____________ Movements: ____________ Start time: ____________ Doreatha MartinFinish time: ____________ Date: ____________ Movements: ____________ Start time: ____________ Doreatha MartinFinish time: ____________  Date: ____________ Movements: ____________ Start time:  ____________ Doreatha MartinFinish time: ____________ Date: ____________ Movements: ____________ Start time: ____________ Doreatha MartinFinish time: ____________ Date: ____________ Movements: ____________ Start time: ____________ Doreatha MartinFinish time: ____________ Date: ____________ Movements: ____________ Start time: ____________ Doreatha MartinFinish time: ____________ Date: ____________ Movements: ____________ Start time: ____________ Doreatha MartinFinish time: ____________ Date: ____________ Movements: ____________ Start time: ____________ Doreatha MartinFinish time: ____________ Date:  ____________ Movements: ____________ Start time: ____________ Doreatha Martin time: ____________  Date: ____________ Movements: ____________ Start time: ____________ Doreatha Martin time: ____________ Date: ____________ Movements: ____________ Start time: ____________ Doreatha Martin time: ____________ Date: ____________ Movements: ____________ Start time: ____________ Doreatha Martin time: ____________ Date: ____________ Movements: ____________ Start time: ____________ Doreatha Martin time: ____________ Date: ____________ Movements: ____________ Start time: ____________ Doreatha Martin time: ____________ Date: ____________ Movements: ____________ Start time: ____________ Doreatha Martin time: ____________ Date: ____________ Movements: ____________ Start time: ____________ Doreatha Martin time: ____________  Date: ____________ Movements: ____________ Start time: ____________ Doreatha Martin time: ____________ Date: ____________ Movements: ____________ Start time: ____________ Doreatha Martin time: ____________ Date: ____________ Movements: ____________ Start time: ____________ Doreatha Martin time: ____________ Date: ____________ Movements: ____________ Start time: ____________ Doreatha Martin time: ____________ Date: ____________ Movements: ____________ Start time: ____________ Doreatha Martin time: ____________ Date: ____________ Movements: ____________ Start time: ____________ Doreatha Martin time: ____________ Date: ____________ Movements: ____________ Start time: ____________ Doreatha Martin time: ____________  Date:  ____________ Movements: ____________ Start time: ____________ Doreatha Martin time: ____________ Date: ____________ Movements: ____________ Start time: ____________ Doreatha Martin time: ____________ Date: ____________ Movements: ____________ Start time: ____________ Doreatha Martin time: ____________ Date: ____________ Movements: ____________ Start time: ____________ Doreatha Martin time: ____________ Date: ____________ Movements: ____________ Start time: ____________ Doreatha Martin time: ____________ Date: ____________ Movements: ____________ Start time: ____________ Doreatha Martin time: ____________ Date: ____________ Movements: ____________ Start time: ____________ Doreatha Martin time: ____________  Date: ____________ Movements: ____________ Start time: ____________ Doreatha Martin time: ____________ Date: ____________ Movements: ____________ Start time: ____________ Doreatha Martin time: ____________ Date: ____________ Movements: ____________ Start time: ____________ Doreatha Martin time: ____________ Date: ____________ Movements: ____________ Start time: ____________ Doreatha Martin time: ____________ Date: ____________ Movements: ____________ Start time: ____________ Doreatha Martin time: ____________ Date: ____________ Movements: ____________ Start time: ____________ Doreatha Martin time: ____________   This information is not intended to replace advice given to you by your health care provider. Make sure you discuss any questions you have with your health care provider.   Document Released: 04/17/2006 Document Revised: 04/08/2014 Document Reviewed: 01/13/2012 Elsevier Interactive Patient Education [employer]

## 2015-12-23 NOTE — Progress Notes (Signed)
Difficulty continuously monitoring FHT's due to fetal movement.

## 2015-12-31 ENCOUNTER — Observation Stay
Admission: EM | Admit: 2015-12-31 | Discharge: 2015-12-31 | Disposition: A | Discharge status: Home / Self Care | Payer: Medicaid Other | Attending: Obstetrics and Gynecology | Admitting: Obstetrics and Gynecology

## 2015-12-31 DIAGNOSIS — A084 Viral intestinal infection, unspecified: Secondary | ICD-10-CM | POA: Diagnosis not present

## 2015-12-31 DIAGNOSIS — Z3490 Encounter for supervision of normal pregnancy, unspecified, unspecified trimester: Secondary | ICD-10-CM

## 2015-12-31 DIAGNOSIS — Z3A29 29 weeks gestation of pregnancy: Secondary | ICD-10-CM | POA: Insufficient documentation

## 2015-12-31 DIAGNOSIS — O98513 Other viral diseases complicating pregnancy, third trimester: Secondary | ICD-10-CM | POA: Diagnosis not present

## 2015-12-31 DIAGNOSIS — O26893 Other specified pregnancy related conditions, third trimester: Secondary | ICD-10-CM | POA: Diagnosis present

## 2015-12-31 LAB — COMPREHENSIVE METABOLIC PANEL
ALT: 10 U/L — AB (ref 14–54)
ANION GAP: 6 (ref 5–15)
AST: 18 U/L (ref 15–41)
Albumin: 3.2 g/dL — ABNORMAL LOW (ref 3.5–5.0)
Alkaline Phosphatase: 103 U/L (ref 38–126)
BUN: <5 mg/dL — ABNORMAL LOW (ref 6–20)
CHLORIDE: 104 mmol/L (ref 101–111)
CO2: 25 mmol/L (ref 22–32)
CREATININE: 0.43 mg/dL — AB (ref 0.44–1.00)
Calcium: 8.8 mg/dL — ABNORMAL LOW (ref 8.9–10.3)
Glucose, Bld: 85 mg/dL (ref 65–99)
Potassium: 3.3 mmol/L — ABNORMAL LOW (ref 3.5–5.1)
SODIUM: 135 mmol/L (ref 135–145)
Total Bilirubin: 0.5 mg/dL (ref 0.3–1.2)
Total Protein: 7.5 g/dL (ref 6.5–8.1)

## 2015-12-31 LAB — URINALYSIS COMPLETE WITH MICROSCOPIC (ARMC ONLY)
BACTERIA UA: NONE SEEN
BILIRUBIN URINE: NEGATIVE
GLUCOSE, UA: NEGATIVE mg/dL
HGB URINE DIPSTICK: NEGATIVE
Ketones, ur: NEGATIVE mg/dL
NITRITE: NEGATIVE
PH: 7 (ref 5.0–8.0)
Protein, ur: NEGATIVE mg/dL
SPECIFIC GRAVITY, URINE: 1.008 (ref 1.005–1.030)

## 2015-12-31 LAB — CBC
HEMATOCRIT: 33.7 % — AB (ref 35.0–47.0)
HEMOGLOBIN: 11.5 g/dL — AB (ref 12.0–16.0)
MCH: 27.1 pg (ref 26.0–34.0)
MCHC: 34 g/dL (ref 32.0–36.0)
MCV: 79.6 fL — ABNORMAL LOW (ref 80.0–100.0)
Platelets: 245 10*3/uL (ref 150–440)
RBC: 4.23 MIL/uL (ref 3.80–5.20)
RDW: 13.4 % (ref 11.5–14.5)
WBC: 10.5 10*3/uL (ref 3.6–11.0)

## 2015-12-31 MED ORDER — ONDANSETRON HCL 4 MG/2ML IJ SOLN
4.0000 mg | Freq: Once | INTRAMUSCULAR | Status: AC
  Administered 2015-12-31: 4 mg via INTRAVENOUS

## 2015-12-31 MED ORDER — KCL IN DEXTROSE-NACL 20-5-0.9 MEQ/L-%-% IV SOLN
INTRAVENOUS | Status: DC
  Administered 2015-12-31: 18:00:00 via INTRAVENOUS
  Filled 2015-12-31 (×11): qty 1000

## 2015-12-31 MED ORDER — LOPERAMIDE HCL 2 MG PO CAPS
ORAL_CAPSULE | ORAL | Status: AC
  Administered 2015-12-31: 2 mg via ORAL
  Filled 2015-12-31: qty 1

## 2015-12-31 MED ORDER — SODIUM CHLORIDE FLUSH 0.9 % IV SOLN
INTRAVENOUS | Status: AC
  Filled 2015-12-31: qty 10

## 2015-12-31 MED ORDER — LOPERAMIDE HCL 2 MG PO CAPS
2.0000 mg | ORAL_CAPSULE | ORAL | Status: DC | PRN
  Administered 2015-12-31: 2 mg via ORAL

## 2015-12-31 MED ORDER — ONDANSETRON HCL 4 MG/2ML IJ SOLN
INTRAMUSCULAR | Status: AC
  Administered 2015-12-31: 4 mg via INTRAVENOUS
  Filled 2015-12-31: qty 2

## 2015-12-31 NOTE — Discharge Summary (Signed)
  1990/07/20, 25 y.o.   MRN: [id number] Krystal Richard 1990/07/20 G3 P2 at 29+5 days based on a 13+2 week u/s  presents for pelvic pressure , cramping , ++ diarhea , and nausea . Diarrhea started 3 days ago and multiple bouts. Able to keep fluid down  . No fever No LOF , no vaginal bleeding , ROS : unremarkable  PObHX : 2 term deliveries ( 37+0 and 40+0) Allergies NKDA  O;Temp 97.9 F (36.6 C) (Oral)   Resp 20   Ht 5\' 6"  (1.676 m)   Wt 155 lb (70.3 kg)   LMP 06/02/2015 (Approximate)   BMI 25.02 kg/m  ABD soft nt , gravid , NABS  CX closed 25%  NST reactive , no decels   Labs: CMP , UA, CBC  Pending  A: Viral GE , reassuring fetal monitoring  P:1 Liter IVF with 20 meq KCL  imodium 2 mg q 6 hrs Precautions given to the pt     Electronically signed by Suzy Bouchardhomas J Schermerhorn, MD at 12/31/2015 4:54 PM

## 2015-12-31 NOTE — OB Triage Note (Signed)
G3P2 presents with c/o diarrhea since Friday night and vomiting that started today after eating a grilled chicken sandwich.  Reports intermittent abdominal cramping that wraps around from stomach to back starting at the same time as the diarrhea on Friday.  Pt reports that the episodes of diarrhea has been too numerous to count, but only one episode of vomiting.  Pt's oral mucus membranes moist, pt reports being able to drink some juice and water.  Pt denies VB, difficulty urinating or decreased fetal mov't.  Pt afebrile and denies being around any sick family or friends.

## 2015-12-31 NOTE — Progress Notes (Signed)
Patient ID: Krystal SiasChelsea M Richard, female   DOB: 08-15-1990, 25 y.o.   MRN: [id number] Krystal Richard 08-15-1990 G3 P2 at 29+5 days based on a 13+2 week u/s  presents for pelvic pressure , cramping , ++ diarhea , and nausea . Diarrhea started 3 days ago and multiple bouts. Able to keep fluid down  . No fever No LOF , no vaginal bleeding , ROS : unremarkable  PObHX : 2 term deliveries ( 37+0 and 40+0) Allergies NKDA  O;Temp 97.9 F (36.6 C) (Oral)   Resp 20   Ht 5\' 6"  (1.676 m)   Wt 155 lb (70.3 kg)   LMP 06/02/2015 (Approximate)   BMI 25.02 kg/m  ABD soft nt , gravid , NABS  CX closed 25%  NST reactive , no decels   Labs: CMP , UA, CBC  Pending  A: Viral GE , reassuring fetal monitoring  P:1 Liter IVF with 20 meq KCL  imodium 2 mg q 6 hrs Precautions given to the pt

## 2015-12-31 NOTE — Progress Notes (Signed)
Patient ID: Krystal SiasChelsea M Dreier, female   DOB: [birth year], 25 y.o.   MRN: [id number] Labs ok  Except K= 3.3 Feels better after 1 liter of fluid D/c home . F/up if diarrhea persists

## 2016-02-04 ENCOUNTER — Inpatient Hospital Stay
Admission: EM | Admit: 2016-02-04 | Discharge: 2016-02-04 | Disposition: A | Discharge status: Home / Self Care | Payer: Medicaid Other | Attending: Obstetrics and Gynecology | Admitting: Obstetrics and Gynecology

## 2016-02-04 ENCOUNTER — Encounter: Payer: Self-pay | Admitting: *Deleted

## 2016-02-04 DIAGNOSIS — Z3A34 34 weeks gestation of pregnancy: Secondary | ICD-10-CM | POA: Diagnosis not present

## 2016-02-04 DIAGNOSIS — Z3493 Encounter for supervision of normal pregnancy, unspecified, third trimester: Secondary | ICD-10-CM | POA: Diagnosis not present

## 2016-02-04 LAB — POCT NITRAZINE TEST: POCT Nitrazine (amniosure): NEGATIVE

## 2016-02-04 MED ORDER — PRENATAL MULTIVITAMIN CH: 1.0000 | ORAL_TABLET | Freq: Every day | ORAL | Status: DC

## 2016-02-04 MED ORDER — CALCIUM CARBONATE ANTACID 500 MG PO CHEW: 2.0000 | CHEWABLE_TABLET | ORAL | Status: DC | PRN

## 2016-02-04 MED ORDER — DOCUSATE SODIUM 100 MG PO CAPS: 100.0000 mg | ORAL_CAPSULE | Freq: Every day | ORAL | Status: DC

## 2016-02-04 MED ORDER — ACETAMINOPHEN 325 MG PO TABS: 650.0000 mg | ORAL_TABLET | ORAL | Status: DC | PRN

## 2016-02-04 MED ORDER — ZOLPIDEM TARTRATE 5 MG PO TABS: 5.0000 mg | ORAL_TABLET | Freq: Every evening | ORAL | Status: DC | PRN

## 2016-02-04 NOTE — Final Progress Note (Signed)
TRIAGE VISIT with NST  CC: Leaking fluid in pregnancy   Krystal Richard is a 25 y.o. [id number]. She is at 3435w0d gestation.  S: Resting comfortably. no CTX, no VB. Active fetal movement. Concerned about leaking   O:  BP 109/61 (BP Location: Left Arm)   Pulse 100   Temp 98.2 F (36.8 C) (Oral)   Resp 20   Ht 5\' 6"  (1.676 m)   Wt 164 lb (74.4 kg)   LMP 06/02/2015 (Approximate)   BMI 26.47 kg/m  No results found for this or any previous visit (from the past 48 hour(s)).   Gen: NAD, AAOx3      Abd: FNTTP      Ext: Non-tender, Nonedmeatous per staff    FHT: 135, mod var, +accels x2, no decels (false decel not audible when maternal repositioning) TOCO: quiet SVE:  deferred  Pooling, nitrazine neg   A/P:  25 y.o. G3P2002 at 935w0d with R/o PPROM.   Labor: not present.   R/o ROM: SSE negative x 3. Normal amount of white discharge. Perfectly normal fluid.  FWB: NST is Reassuring Cat 1 tracing.  D/c home stable, precautions reviewed, follow-up as scheduled.

## 2016-02-04 NOTE — OB Triage Note (Signed)
Patient complains of feeling like her water broke around midnight.  No complaints of contractions.  Patient states that she has felt the baby move.

## 2016-03-08 ENCOUNTER — Inpatient Hospital Stay
Admission: EM | Admit: 2016-03-08 | Discharge: 2016-03-09 | DRG: 775 | Disposition: A | Discharge status: Home / Self Care | Payer: Medicaid Other | Attending: Obstetrics and Gynecology | Admitting: Obstetrics and Gynecology

## 2016-03-08 ENCOUNTER — Inpatient Hospital Stay: Payer: Medicaid Other | Admitting: Anesthesiology

## 2016-03-08 DIAGNOSIS — Z3A38 38 weeks gestation of pregnancy: Secondary | ICD-10-CM | POA: Diagnosis not present

## 2016-03-08 DIAGNOSIS — Z3493 Encounter for supervision of normal pregnancy, unspecified, third trimester: Secondary | ICD-10-CM | POA: Diagnosis present

## 2016-03-08 LAB — CBC
HEMATOCRIT: 33.3 % — AB (ref 35.0–47.0)
Hemoglobin: 11 g/dL — ABNORMAL LOW (ref 12.0–16.0)
MCH: 25.3 pg — ABNORMAL LOW (ref 26.0–34.0)
MCHC: 33 g/dL (ref 32.0–36.0)
MCV: 76.7 fL — AB (ref 80.0–100.0)
PLATELETS: 180 10*3/uL (ref 150–440)
RBC: 4.35 MIL/uL (ref 3.80–5.20)
RDW: 16.5 % — AB (ref 11.5–14.5)
WBC: 10 10*3/uL (ref 3.6–11.0)

## 2016-03-08 LAB — TYPE AND SCREEN
ABO/RH(D): O POS
ANTIBODY SCREEN: NEGATIVE

## 2016-03-08 LAB — RAPID HIV SCREEN (HIV 1/2 AB+AG)
HIV 1/2 Antibodies: NONREACTIVE
HIV-1 P24 Antigen - HIV24: NONREACTIVE

## 2016-03-08 MED ORDER — NALBUPHINE HCL 10 MG/ML IJ SOLN: 5.0000 mg | INTRAMUSCULAR | Status: DC | PRN

## 2016-03-08 MED ORDER — LIDOCAINE-EPINEPHRINE (PF) 1.5 %-1:200000 IJ SOLN
INTRAMUSCULAR | Status: DC | PRN
  Administered 2016-03-08: 3 mL via PERINEURAL

## 2016-03-08 MED ORDER — SENNOSIDES-DOCUSATE SODIUM 8.6-50 MG PO TABS
2.0000 | ORAL_TABLET | ORAL | Status: DC
  Administered 2016-03-08: 2 via ORAL
  Filled 2016-03-08: qty 2

## 2016-03-08 MED ORDER — BUTORPHANOL TARTRATE 1 MG/ML IJ SOLN
INTRAMUSCULAR | Status: AC
  Administered 2016-03-08: 1 mg
  Filled 2016-03-08: qty 1

## 2016-03-08 MED ORDER — FENTANYL 2.5 MCG/ML W/ROPIVACAINE 0.2% IN NS 100 ML EPIDURAL INFUSION (ARMC-ANES)
EPIDURAL | Status: DC | PRN
  Administered 2016-03-08: 10 mL/h via EPIDURAL

## 2016-03-08 MED ORDER — FLEET ENEMA 7-19 GM/118ML RE ENEM: 1.0000 | ENEMA | Freq: Every day | RECTAL | Status: DC | PRN

## 2016-03-08 MED ORDER — MISOPROSTOL 200 MCG PO TABS
ORAL_TABLET | ORAL | Status: AC
  Filled 2016-03-08: qty 4

## 2016-03-08 MED ORDER — LACTATED RINGERS IV SOLN
500.0000 mL | INTRAVENOUS | Status: DC | PRN
  Administered 2016-03-08: 500 mL via INTRAVENOUS

## 2016-03-08 MED ORDER — ACETAMINOPHEN 325 MG PO TABS: 650.0000 mg | ORAL_TABLET | ORAL | Status: DC | PRN

## 2016-03-08 MED ORDER — MEASLES, MUMPS & RUBELLA VAC ~~LOC~~ INJ
0.5000 mL | INJECTION | Freq: Once | SUBCUTANEOUS | Status: DC
  Filled 2016-03-08: qty 0.5

## 2016-03-08 MED ORDER — LIDOCAINE HCL (PF) 1 % IJ SOLN
INTRAMUSCULAR | Status: DC | PRN
  Administered 2016-03-08: 3 mL via SUBCUTANEOUS

## 2016-03-08 MED ORDER — OXYTOCIN 10 UNIT/ML IJ SOLN
INTRAMUSCULAR | Status: AC
  Filled 2016-03-08: qty 2

## 2016-03-08 MED ORDER — OXYTOCIN 40 UNITS IN LACTATED RINGERS INFUSION - SIMPLE MED
INTRAVENOUS | Status: AC
  Administered 2016-03-08: 500 mL via INTRAVENOUS
  Filled 2016-03-08: qty 1000

## 2016-03-08 MED ORDER — AMMONIA AROMATIC IN INHA
RESPIRATORY_TRACT | Status: AC
  Filled 2016-03-08: qty 10

## 2016-03-08 MED ORDER — SIMETHICONE 80 MG PO CHEW: 80.0000 mg | CHEWABLE_TABLET | ORAL | Status: DC | PRN

## 2016-03-08 MED ORDER — COCONUT OIL OIL: 1.0000 "application " | TOPICAL_OIL | Status: DC | PRN

## 2016-03-08 MED ORDER — TETANUS-DIPHTH-ACELL PERTUSSIS 5-2.5-18.5 LF-MCG/0.5 IM SUSP: 0.5000 mL | Freq: Once | INTRAMUSCULAR | Status: DC

## 2016-03-08 MED ORDER — IBUPROFEN 600 MG PO TABS
600.0000 mg | ORAL_TABLET | Freq: Four times a day (QID) | ORAL | Status: DC
  Administered 2016-03-08 – 2016-03-09 (×4): 600 mg via ORAL
  Filled 2016-03-08 (×5): qty 1

## 2016-03-08 MED ORDER — NALBUPHINE HCL 10 MG/ML IJ SOLN: 5.0000 mg | Freq: Once | INTRAMUSCULAR | Status: DC | PRN

## 2016-03-08 MED ORDER — ONDANSETRON HCL 4 MG/2ML IJ SOLN: 4.0000 mg | Freq: Four times a day (QID) | INTRAMUSCULAR | Status: DC | PRN

## 2016-03-08 MED ORDER — OXYTOCIN BOLUS FROM INFUSION
500.0000 mL | Freq: Once | INTRAVENOUS | Status: AC
Start: 2016-03-08 — End: 2016-03-08
  Administered 2016-03-08: 500 mL via INTRAVENOUS

## 2016-03-08 MED ORDER — SODIUM CHLORIDE 0.9% FLUSH: 3.0000 mL | INTRAVENOUS | Status: DC | PRN

## 2016-03-08 MED ORDER — ONDANSETRON HCL 4 MG PO TABS: 4.0000 mg | ORAL_TABLET | ORAL | Status: DC | PRN

## 2016-03-08 MED ORDER — SOD CITRATE-CITRIC ACID 500-334 MG/5ML PO SOLN: 30.0000 mL | ORAL | Status: DC | PRN

## 2016-03-08 MED ORDER — DIPHENHYDRAMINE HCL 25 MG PO CAPS: 25.0000 mg | ORAL_CAPSULE | ORAL | Status: DC | PRN

## 2016-03-08 MED ORDER — SODIUM CHLORIDE 0.9 % IV SOLN: 250.0000 mL | INTRAVENOUS | Status: DC | PRN

## 2016-03-08 MED ORDER — LIDOCAINE HCL (PF) 1 % IJ SOLN: 30.0000 mL | INTRAMUSCULAR | Status: DC | PRN

## 2016-03-08 MED ORDER — ZOLPIDEM TARTRATE 5 MG PO TABS: 5.0000 mg | ORAL_TABLET | Freq: Every evening | ORAL | Status: DC | PRN

## 2016-03-08 MED ORDER — LACTATED RINGERS IV SOLN: INTRAVENOUS | Status: DC

## 2016-03-08 MED ORDER — BUPIVACAINE HCL (PF) 0.25 % IJ SOLN
INTRAMUSCULAR | Status: DC | PRN
  Administered 2016-03-08 (×2): 4 mL via EPIDURAL

## 2016-03-08 MED ORDER — BISACODYL 10 MG RE SUPP: 10.0000 mg | Freq: Every day | RECTAL | Status: DC | PRN

## 2016-03-08 MED ORDER — ONDANSETRON HCL 4 MG/2ML IJ SOLN: 4.0000 mg | INTRAMUSCULAR | Status: DC | PRN

## 2016-03-08 MED ORDER — BENZOCAINE-MENTHOL 20-0.5 % EX AERO
1.0000 "application " | INHALATION_SPRAY | CUTANEOUS | Status: DC | PRN
  Administered 2016-03-08: 1 via TOPICAL
  Filled 2016-03-08: qty 56

## 2016-03-08 MED ORDER — DIPHENHYDRAMINE HCL 25 MG PO CAPS: 25.0000 mg | ORAL_CAPSULE | Freq: Four times a day (QID) | ORAL | Status: DC | PRN

## 2016-03-08 MED ORDER — FENTANYL 2.5 MCG/ML W/ROPIVACAINE 0.2% IN NS 100 ML EPIDURAL INFUSION (ARMC-ANES)
EPIDURAL | Status: AC
  Filled 2016-03-08: qty 100

## 2016-03-08 MED ORDER — SODIUM CHLORIDE 0.9% FLUSH: 3.0000 mL | Freq: Two times a day (BID) | INTRAVENOUS | Status: DC

## 2016-03-08 MED ORDER — DIBUCAINE 1 % RE OINT: 1.0000 "application " | TOPICAL_OINTMENT | RECTAL | Status: DC | PRN

## 2016-03-08 MED ORDER — WITCH HAZEL-GLYCERIN EX PADS: 1.0000 "application " | MEDICATED_PAD | CUTANEOUS | Status: DC | PRN

## 2016-03-08 MED ORDER — OXYTOCIN 40 UNITS IN LACTATED RINGERS INFUSION - SIMPLE MED
2.5000 [IU]/h | INTRAVENOUS | Status: DC
  Filled 2016-03-08: qty 1000

## 2016-03-08 MED ORDER — FENTANYL 2.5 MCG/ML W/ROPIVACAINE 0.2% IN NS 100 ML EPIDURAL INFUSION (ARMC-ANES): 10.0000 mL/h | EPIDURAL | Status: DC

## 2016-03-08 MED ORDER — NALOXONE HCL 2 MG/2ML IJ SOSY: 1.0000 ug/kg/h | PREFILLED_SYRINGE | INTRAVENOUS | Status: DC | PRN

## 2016-03-08 MED ORDER — DIPHENHYDRAMINE HCL 50 MG/ML IJ SOLN: 12.5000 mg | INTRAMUSCULAR | Status: DC | PRN

## 2016-03-08 MED ORDER — LIDOCAINE HCL (PF) 1 % IJ SOLN
INTRAMUSCULAR | Status: AC
  Filled 2016-03-08: qty 30

## 2016-03-08 MED ORDER — NALOXONE HCL 0.4 MG/ML IJ SOLN: 0.4000 mg | INTRAMUSCULAR | Status: DC | PRN

## 2016-03-08 MED ORDER — BUTORPHANOL TARTRATE 1 MG/ML IJ SOLN: 1.0000 mg | Freq: Once | INTRAMUSCULAR | Status: DC

## 2016-03-08 MED ORDER — PRENATAL MULTIVITAMIN CH
1.0000 | ORAL_TABLET | Freq: Every day | ORAL | Status: DC
  Administered 2016-03-09: 1 via ORAL
  Filled 2016-03-08 (×2): qty 1

## 2016-03-08 NOTE — Anesthesia Procedure Notes (Signed)
Epidural Patient location during procedure: OB Start time: 03/08/2016 8:46 AM End time: 03/08/2016 8:49 AM  Staffing Anesthesiologist: Rosaria FerriesPISCITELLO, JOSEPH K Resident/CRNA: Stormy FabianURTIS, Angeliki Mates Performed: resident/CRNA   Preanesthetic Checklist Completed: patient identified, site marked, surgical consent, pre-op evaluation, timeout performed, IV checked, risks and benefits discussed and monitors and equipment checked  Epidural Patient position: sitting Prep: Betadine Patient monitoring: heart rate, continuous pulse ox and blood pressure Approach: midline Location: L3-L4 Injection technique: LOR saline  Needle:  Needle type: Tuohy  Needle gauge: 17 G Needle length: 9 cm and 9 Needle insertion depth: 6 and 11 cm Catheter type: closed end flexible Catheter size: 19 Gauge Test dose: negative and 1.5% lidocaine with Epi 1:200 K  Assessment Sensory level: T10 Events: blood not aspirated, injection not painful, no injection resistance, negative IV test and no paresthesia  Additional Notes Pt. Evaluated and documentation done after procedure finished. Patient identified. Risks/Benefits/Options discussed with patient including but not limited to bleeding, infection, nerve damage, paralysis, failed block, incomplete pain control, headache, blood pressure changes, nausea, vomiting, reactions to medication both or allergic, itching and postpartum back pain. Confirmed with bedside nurse the patient's most recent platelet count. Confirmed with patient that they are not currently taking any anticoagulation, have any bleeding history or any family history of bleeding disorders. Patient expressed understanding and wished to proceed. All questions were answered. Sterile technique was used throughout the entire procedure. Please see nursing notes for vital signs. Test dose was given through epidural catheter and negative prior to continuing to dose epidural or start infusion. Warning signs of high block given  to the patient including shortness of breath, tingling/numbness in hands, complete motor block, or any concerning symptoms with instructions to call for help. Patient was given instructions on fall risk and not to get out of bed. All questions and concerns addressed with instructions to call with any issues or inadequate analgesia.   Patient tolerated the insertion well without immediate complications.Reason for block:procedure for pain

## 2016-03-08 NOTE — Anesthesia Preprocedure Evaluation (Signed)
Anesthesia Evaluation  Patient identified by MRN, date of birth, ID band Patient awake    Reviewed: Allergy & Precautions, H&P , NPO status , Patient's Chart, lab work & pertinent test results  History of Anesthesia Complications Negative for: history of anesthetic complications  Airway Mallampati: II  TM Distance: >3 FB Neck ROM: full    Dental no notable dental hx.    Pulmonary neg pulmonary ROS,    Pulmonary exam normal        Cardiovascular negative cardio ROS Normal cardiovascular exam     Neuro/Psych Sciatic pain  Neuromuscular disease negative neurological ROS  negative psych ROS   GI/Hepatic negative GI ROS, Neg liver ROS,   Endo/Other  negative endocrine ROS  Renal/GU negative Renal ROS  negative genitourinary   Musculoskeletal   Abdominal   Peds  Hematology  (+) anemia , Hx of anemia   Anesthesia Other Findings   Reproductive/Obstetrics (+) Pregnancy                             Anesthesia Physical Anesthesia Plan  ASA: II  Anesthesia Plan: Epidural   Post-op Pain Management:    Induction:   Airway Management Planned:   Additional Equipment:   Intra-op Plan:   Post-operative Plan:   Informed Consent: I have reviewed the patients History and Physical, chart, labs and discussed the procedure including the risks, benefits and alternatives for the proposed anesthesia with the patient or authorized representative who has indicated his/her understanding and acceptance.     Plan Discussed with: Anesthesiologist  Anesthesia Plan Comments:         Anesthesia Quick Evaluation

## 2016-03-08 NOTE — H&P (Signed)
Krystal Richard is a 25 y.o. female presenting for labor S/S. OB History    Gravida Para Term Preterm AB Living   3 2 2     2    SAB TAB Ectopic Multiple Live Births         0 2     Past Medical History:  Diagnosis Date  . Breast mass   . Sciatic pain   . SOB (shortness of breath) on exertion    Past Surgical History:  Procedure Laterality Date  . EYE SURGERY     Family History: family history includes Hypertension in her mother. Social History:  reports that she has never smoked. She has never used smokeless tobacco. She reports that she does not drink alcohol or use drugs.     Maternal Diabetes: neg  Genetic Screening:  Maternal Ultrasounds/Referrals: Fetal Ultrasounds or other Referrals:  Maternal Substance Abuse:  Neg Significant Maternal Medications:  Significant Maternal Lab Results:  Other Comments:   Review of Systems  Constitutional: Negative.   HENT: Negative.   Eyes: Negative.   Respiratory: Negative.   Cardiovascular: Negative.   Gastrointestinal: Negative.   Genitourinary: Negative.   Musculoskeletal: Negative.   Skin: Negative.   Neurological: Negative.   Endo/Heme/Allergies: Negative.   Psychiatric/Behavioral: Negative.    History Dilation: 8 Effacement (%): 100 Station: -2 Exam by:: Milllner, RN Blood pressure 113/74, pulse 99, temperature 98 F (36.7 C), temperature source Oral, last menstrual period 06/02/2015, currently breastfeeding. Exam Physical Exam  Prenatal labs: ABO, Rh: --/--/O POS (04/20 0051) Antibody:   Rubella: Immune (05/18 0000) RPR: Nonreactive (05/18 0000)  HBsAg: Negative (05/18 0000)  HIV:    GBS:   neg  Assessment/Plan: A: 1. IUP at term 2. Active Labor P: Antic SVD 2. Stadol 1 mg IV now 3. Plan Epidural   Krystal Richard 03/08/2016, 7:51 AM

## 2016-03-08 NOTE — Discharge Summary (Signed)
Obstetric Discharge Summary Reason for Admission: active labor  Prenatal Procedures: ultrasound  Intrapartum Procedures: spontaneous vaginal delivery Postpartum Procedures: none Complications-Operative and Postpartum: none Hemoglobin  Date Value Ref Range Status  03/08/2016 11.0 (L) 12.0 - 16.0 g/dL Final  16/10/960404/10/2014 54.012.0 g/dL Final   HCT  Date Value Ref Range Status  03/08/2016 33.3 (L) 35.0 - 47.0 % Final  07/08/2014 36 % Final   Hematocrit  Date Value Ref Range Status  02/28/2015 36.2 34.0 - 46.6 % Final    Physical Exam:  General: alert, cooperative and appears stated age 31Lochia: mod Uterine Fundus: firm Incision: none DVT Evaluation: No evidence of DVT seen on physical exam.  Discharge Diagnoses: Term Pregnancy-delivered  Discharge Information: Date: 03/08/2016 Activity: unrestricted and pelvic rest Diet:reg Medications: PNV and Ibuprofen Condition: stable Instructions:  home   Newborn Data: Baby Girl Home with mom  Krystal Richard 03/08/2016, 10:13 AM

## 2016-03-08 NOTE — Progress Notes (Signed)
Krystal Richard is a 25 y.o. [id number] at 6827w5d by LMP  Of 06/02/15 with EDD of 03/12/16 admitted for active labor. [facility] subchorionic [facility] at 5 4/7 weeks. Increased risk for Dwons:1:73 with normal AFP. Pt did not attend the [facility] appt for discussion re: abnormal Downs. US due date C/W LMP.  Subjective: "Hurting with UC's"  Objective: BP 113/74   Pulse 99   Temp 98 F (36.7 C) (Oral)   LMP 06/02/2015 (Approximate)  No intake/output data recorded. No intake/output data recorded.  FHT:120, accel to 150's, CAT 1, no decels UC:  q 1-2 mins, lasting 60+ secs SVE:   Dilation: 8 Effacement (%): 100 Station: -2 Exam by:: Milllner, RN  Labs: Lab Results  Component Value Date   WBC 10.5 12/31/2015   HGB 11.5 (L) 12/31/2015   HCT 33.7 (L) 12/31/2015   MCV 79.6 (L) 12/31/2015   PLT 245 12/31/2015    Assessment / Plan: A: IUP at term 2. PMH of PTL 3. GBS neg P: Epidural planned Continue to monitor uC/FHT 3. Antic SVD  Sharee Pimplearon W Orvin Netter 03/08/2016, 8:15 AM

## 2016-03-08 NOTE — OB Triage Note (Signed)
Patient came in for observation for labor evaluation. Patient reports contractions every three to four minutes. Patient denies  leaking of fluid, vaginal bleeding and spotting. Vital signs stable and patient afebrile. Patient states she has been feeling the baby move fine. FHR baseline 135 with moderate variability with accelerations 15 x 15 and no decelerations. Family at bedside. Will continue to monitor.

## 2016-03-08 NOTE — Progress Notes (Signed)
[facility] Up to BR for post delivery void.  Pt. Voided mod amount, pericare given, clean peripad applied with clean underwear.  Pt. Ready for transfer to postpartum unit via WC, infant also transferred to PPU via basinet. Report given.

## 2016-03-08 NOTE — Discharge Instructions (Signed)
Care After Vaginal Delivery °Congratulations on your new baby!! ° °Refer to this sheet in the next few weeks. These discharge instructions provide you with information on caring for yourself after delivery. Your caregiver may also give you specific instructions. Your treatment has been planned according to the most current medical practices available, but problems sometimes occur. Call your caregiver if you have any problems or questions after you go home. ° °HOME CARE INSTRUCTIONS °· Take over-the-counter or prescription medicines only as directed by your caregiver or pharmacist. °· Do not drink alcohol, especially if you are breastfeeding or taking medicine to relieve pain. °· Do not chew or smoke tobacco. °· Do not use illegal drugs. °· Continue to use good perineal care. Good perineal care includes: °¨ Wiping your perineum from front to back. °¨ Keeping your perineum clean. °· Do not use tampons or douche until your caregiver says it is okay. °· Shower, wash your hair, and take tub baths as directed by your caregiver. °· Wear a well-fitting bra that provides breast support. °· Eat healthy foods. °· Drink enough fluids to keep your urine clear or pale yellow. °· Eat high-fiber foods such as whole grain cereals and breads, brown rice, beans, and fresh fruits and vegetables every day. These foods may help prevent or relieve constipation. °· Follow your caregiver's recommendations regarding resumption of activities such as climbing stairs, driving, lifting, exercising, or traveling. Specifically, no driving for two weeks, so that you are comfortable reacting quickly in an emergency. °· Talk to your caregiver about resuming sexual activities. Resumption of sexual activities is dependent upon your risk of infection, your rate of healing, and your comfort and desire to resume sexual activity. Usually we recommend waiting about six weeks, or until your bleeding stops and you are interested in sex. °· Try to have someone  help you with your household activities and your newborn for at least a few days after you leave the hospital. Even longer is better. °· Rest as much as possible. Try to rest or take a nap when your newborn is sleeping. Sleep deprivation can be very hard after delivery. °· Increase your activities gradually. °· Keep all of your scheduled postpartum appointments. It is very important to keep your scheduled follow-up appointments. At these appointments, your caregiver will be checking to make sure that you are healing physically and emotionally. ° °SEEK MEDICAL CARE IF:  °· You are passing large clots from your vagina.  °· You have a foul smelling discharge from your vagina. °· You have trouble urinating. °· You are urinating frequently. °· You have pain when you urinate. °· You have a change in your bowel movements. °· You have increasing redness, pain, or swelling near your vaginal incision (episiotomy) or vaginal tear. °· You have pus draining from your episiotomy or vaginal tear. °· Your episiotomy or vaginal tear is separating. °· You have painful, hard, or reddened breasts. °· You have a severe headache. °· You have blurred vision or see spots. °· You feel sad or depressed. °· You have thoughts of hurting yourself or your newborn. °· You have questions about your care, the care of your newborn, or medicines. °· You are dizzy or light-headed. °· You have a rash. °· You have nausea or vomiting. °· You were breastfeeding and have not had a menstrual period within 12 weeks after you stopped breastfeeding. °· You are not breastfeeding and have not had a menstrual period by the 12th week after delivery. °· You   have a fever. ° °SEEK IMMEDIATE MEDICAL CARE IF:  °· You have persistent pain. °· You have chest pain. °· You have shortness of breath. °· You faint. °· You have leg pain. °· You have stomach pain. °· Your vaginal bleeding saturates two or more sanitary pads in 1 hour. ° °MAKE SURE YOU:  °· Understand these  instructions. °· Will get help right away if you are not doing well or get worse. °·  °Document Released: 03/15/2000 Document Revised: 08/02/2013 Document Reviewed: 11/13/2011 ° °ExitCare® Patient Information ©2015 [employer]. This information is not intended to replace advice given to you by your health care provider. Make sure you discuss any questions you have with your health care provider. ° °

## 2016-03-09 LAB — CBC
HEMATOCRIT: 35 % (ref 35.0–47.0)
HEMOGLOBIN: 11.2 g/dL — AB (ref 12.0–16.0)
MCH: 24.4 pg — ABNORMAL LOW (ref 26.0–34.0)
MCHC: 31.9 g/dL — AB (ref 32.0–36.0)
MCV: 76.4 fL — ABNORMAL LOW (ref 80.0–100.0)
Platelets: 180 10*3/uL (ref 150–440)
RBC: 4.58 MIL/uL (ref 3.80–5.20)
RDW: 16.8 % — AB (ref 11.5–14.5)
WBC: 11.3 10*3/uL — AB (ref 3.6–11.0)

## 2016-03-09 LAB — RPR: RPR Ser Ql: NONREACTIVE

## 2016-03-09 MED ORDER — HYDROCODONE-ACETAMINOPHEN 5-325 MG PO TABS
1.0000 | ORAL_TABLET | Freq: Four times a day (QID) | ORAL | Status: DC | PRN
  Administered 2016-03-09: 1 via ORAL

## 2016-03-09 MED ORDER — HYDROCODONE-ACETAMINOPHEN 5-325 MG PO TABS
ORAL_TABLET | ORAL | Status: DC
Start: 2016-03-09 — End: 2016-03-09
  Filled 2016-03-09: qty 1

## 2016-03-09 NOTE — Progress Notes (Signed)
Post Partum Day 1 Subjective: no complaints and up ad lib  Objective: Blood pressure 103/63, pulse 71, temperature 97.8 F (36.6 C), temperature source Oral, resp. rate 18, weight 75.8 kg (167 lb 3.2 oz), last menstrual period 06/02/2015, SpO2 100 %, unknown if currently breastfeeding.  Physical Exam:  General: alert and cooperative Lochia: appropriate Uterine Fundus: firm Incision: Lac healing DVT Evaluation: No evidence of DVT seen on physical exam.   Recent Labs  03/08/16 0742 03/09/16 0544  HGB 11.0* 11.2*  HCT 33.3* 35.0    Assessment/Plan: Plan for discharge tomorrow   LOS: 1 day   Sharee Pimplearon W Omarian Jaquith 03/09/2016, 10:17 AM

## 2016-03-09 NOTE — Progress Notes (Signed)
Patient understands all discharge instructions and the need to make follow up appointments. Patient discharge via wheelchair with RN. 

## 2016-03-19 NOTE — Anesthesia Postprocedure Evaluation (Signed)
Anesthesia Post Note  Patient: Krystal Richard  Procedure(s) Performed: * No procedures listed *  Comments: Patient not seen after removal of epidural and left the hospital before post Op anesthesia team could evaluate the patient.  Patient called multiple times on multiple days but would not receive the call.     Last Vitals: There were no vitals filed for this visit.  Last Pain: There were no vitals filed for this visit.               Cleda MccreedyJoseph K Piscitello

## 2016-05-12 ENCOUNTER — Emergency Department
Admission: EM | Admit: 2016-05-12 | Discharge: 2016-05-12 | Disposition: A | Discharge status: Home / Self Care | Payer: Medicaid Other | Attending: Emergency Medicine | Admitting: Emergency Medicine

## 2016-05-12 ENCOUNTER — Encounter: Payer: Self-pay | Admitting: Emergency Medicine

## 2016-05-12 DIAGNOSIS — Z5321 Procedure and treatment not carried out due to patient leaving prior to being seen by health care provider: Secondary | ICD-10-CM | POA: Diagnosis not present

## 2016-05-12 DIAGNOSIS — R1032 Left lower quadrant pain: Secondary | ICD-10-CM | POA: Diagnosis not present

## 2016-05-12 DIAGNOSIS — R220 Localized swelling, mass and lump, head: Secondary | ICD-10-CM | POA: Diagnosis not present

## 2016-05-12 DIAGNOSIS — R6884 Jaw pain: Secondary | ICD-10-CM | POA: Insufficient documentation

## 2016-05-12 DIAGNOSIS — R1031 Right lower quadrant pain: Secondary | ICD-10-CM | POA: Insufficient documentation

## 2016-05-12 DIAGNOSIS — R079 Chest pain, unspecified: Secondary | ICD-10-CM | POA: Diagnosis present

## 2016-05-12 DIAGNOSIS — Z791 Long term (current) use of non-steroidal anti-inflammatories (NSAID): Secondary | ICD-10-CM | POA: Insufficient documentation

## 2016-05-12 LAB — URINALYSIS, COMPLETE (UACMP) WITH MICROSCOPIC
BILIRUBIN URINE: NEGATIVE
GLUCOSE, UA: NEGATIVE mg/dL
Hgb urine dipstick: NEGATIVE
KETONES UR: NEGATIVE mg/dL
LEUKOCYTES UA: NEGATIVE
Nitrite: NEGATIVE
PROTEIN: NEGATIVE mg/dL
Specific Gravity, Urine: 1.011 (ref 1.005–1.030)
pH: 6 (ref 5.0–8.0)

## 2016-05-12 LAB — COMPREHENSIVE METABOLIC PANEL
ALT: 10 U/L — ABNORMAL LOW (ref 14–54)
ANION GAP: 7 (ref 5–15)
AST: 15 U/L (ref 15–41)
Albumin: 3.9 g/dL (ref 3.5–5.0)
Alkaline Phosphatase: 65 U/L (ref 38–126)
BUN: 10 mg/dL (ref 6–20)
CHLORIDE: 103 mmol/L (ref 101–111)
CO2: 27 mmol/L (ref 22–32)
Calcium: 8.8 mg/dL — ABNORMAL LOW (ref 8.9–10.3)
Creatinine, Ser: 0.68 mg/dL (ref 0.44–1.00)
GFR calc non Af Amer: >60 mL/min (ref >60)
Glucose, Bld: 113 mg/dL — ABNORMAL HIGH (ref 65–99)
POTASSIUM: 4 mmol/L (ref 3.5–5.1)
Sodium: 137 mmol/L (ref 135–145)
Total Bilirubin: 0.4 mg/dL (ref 0.3–1.2)
Total Protein: 7.3 g/dL (ref 6.5–8.1)

## 2016-05-12 LAB — CBC
HEMATOCRIT: 32.7 % — AB (ref 35.0–47.0)
Hemoglobin: 10.7 g/dL — ABNORMAL LOW (ref 12.0–16.0)
MCH: 25.8 pg — ABNORMAL LOW (ref 26.0–34.0)
MCHC: 32.6 g/dL (ref 32.0–36.0)
MCV: 79.1 fL — AB (ref 80.0–100.0)
Platelets: 309 10*3/uL (ref 150–440)
RBC: 4.13 MIL/uL (ref 3.80–5.20)
RDW: 16.3 % — ABNORMAL HIGH (ref 11.5–14.5)
WBC: 7.3 10*3/uL (ref 3.6–11.0)

## 2016-05-12 LAB — TROPONIN I: Troponin I: <0.03 ng/mL (ref <0.03)

## 2016-05-12 LAB — POCT PREGNANCY, URINE: PREG TEST UR: NEGATIVE

## 2016-05-12 NOTE — ED Triage Notes (Signed)
Pt states has had one week of right sided lower chest pain. Pt states tonight began to experience right lower jaw swelling. Pt states she has also had bilateral lower abd pain since last pm. Pt states she has also had right sided facial pain since last pm. Pt appears in no acute distress in triage.

## 2016-08-13 ENCOUNTER — Emergency Department
Admission: EM | Admit: 2016-08-13 | Discharge: 2016-08-13 | Disposition: A | Discharge status: Home / Self Care | Payer: Medicaid Other | Attending: Emergency Medicine | Admitting: Emergency Medicine

## 2016-08-13 ENCOUNTER — Encounter: Payer: Self-pay | Admitting: Emergency Medicine

## 2016-08-13 DIAGNOSIS — R112 Nausea with vomiting, unspecified: Secondary | ICD-10-CM | POA: Insufficient documentation

## 2016-08-13 DIAGNOSIS — R197 Diarrhea, unspecified: Secondary | ICD-10-CM | POA: Insufficient documentation

## 2016-08-13 DIAGNOSIS — R1013 Epigastric pain: Secondary | ICD-10-CM | POA: Insufficient documentation

## 2016-08-13 DIAGNOSIS — R1110 Vomiting, unspecified: Secondary | ICD-10-CM

## 2016-08-13 LAB — CBC
HCT: 39.6 % (ref 35.0–47.0)
Hemoglobin: 12.9 g/dL (ref 12.0–16.0)
MCH: 26.1 pg (ref 26.0–34.0)
MCHC: 32.5 g/dL (ref 32.0–36.0)
MCV: 80.2 fL (ref 80.0–100.0)
PLATELETS: 306 10*3/uL (ref 150–440)
RBC: 4.94 MIL/uL (ref 3.80–5.20)
RDW: 13.5 % (ref 11.5–14.5)
WBC: 8.2 10*3/uL (ref 3.6–11.0)

## 2016-08-13 LAB — COMPREHENSIVE METABOLIC PANEL
ALT: 10 U/L — AB (ref 14–54)
AST: 12 U/L — AB (ref 15–41)
Albumin: 3.8 g/dL (ref 3.5–5.0)
Alkaline Phosphatase: 45 U/L (ref 38–126)
Anion gap: 7 (ref 5–15)
BILIRUBIN TOTAL: 0.4 mg/dL (ref 0.3–1.2)
BUN: 10 mg/dL (ref 6–20)
CO2: 25 mmol/L (ref 22–32)
CREATININE: 0.53 mg/dL (ref 0.44–1.00)
Calcium: 9.1 mg/dL (ref 8.9–10.3)
Chloride: 105 mmol/L (ref 101–111)
GFR calc Af Amer: >60 mL/min (ref >60)
GFR calc non Af Amer: >60 mL/min (ref >60)
Glucose, Bld: 101 mg/dL — ABNORMAL HIGH (ref 65–99)
Potassium: 4.7 mmol/L (ref 3.5–5.1)
Sodium: 137 mmol/L (ref 135–145)
TOTAL PROTEIN: 7.3 g/dL (ref 6.5–8.1)

## 2016-08-13 LAB — URINALYSIS, COMPLETE (UACMP) WITH MICROSCOPIC
Bacteria, UA: NONE SEEN
Bilirubin Urine: NEGATIVE
Glucose, UA: NEGATIVE mg/dL
Hgb urine dipstick: NEGATIVE
KETONES UR: NEGATIVE mg/dL
Leukocytes, UA: NEGATIVE
Nitrite: NEGATIVE
PH: 5 (ref 5.0–8.0)
Protein, ur: NEGATIVE mg/dL
SPECIFIC GRAVITY, URINE: 1.026 (ref 1.005–1.030)

## 2016-08-13 LAB — LIPASE, BLOOD: Lipase: 15 U/L (ref 11–51)

## 2016-08-13 LAB — POCT PREGNANCY, URINE: Preg Test, Ur: NEGATIVE

## 2016-08-13 MED ORDER — ONDANSETRON 4 MG PO TBDP: 4.0000 mg | ORAL_TABLET | Freq: Three times a day (TID) | ORAL | 0 refills | Status: DC | PRN

## 2016-08-13 MED ORDER — DICYCLOMINE HCL 10 MG PO CAPS
10.0000 mg | ORAL_CAPSULE | Freq: Once | ORAL | Status: AC
  Administered 2016-08-13: 10 mg via ORAL
  Filled 2016-08-13: qty 1

## 2016-08-13 MED ORDER — DICYCLOMINE HCL 10 MG PO CAPS: 10.0000 mg | ORAL_CAPSULE | Freq: Four times a day (QID) | ORAL | 0 refills | Status: DC

## 2016-08-13 MED ORDER — ONDANSETRON 4 MG PO TBDP
4.0000 mg | ORAL_TABLET | Freq: Once | ORAL | Status: AC
  Administered 2016-08-13: 4 mg via ORAL
  Filled 2016-08-13: qty 1

## 2016-08-13 NOTE — ED Triage Notes (Signed)
Pt c/o generalized abdominal pain.  Was [facility] for same and told she has gastritis. c/o NVD for last week. Reports they switched her birth control.  Still c/o same sx including NVD.  3 episode vomiting today per pt.  Smiling/laughing and interacting with triage RN. NAD. VSS. Reports not pregnant.

## 2016-08-13 NOTE — ED Provider Notes (Signed)
[facility] Emergency Department Provider Note  ____________________________________________  Time seen: Approximately 2:43 PM  I have reviewed the triage vital signs and the nursing notes.   HISTORY  Chief Complaint Abdominal Pain   HPI Krystal Richard is a 26 y.o. female no significant past medical history who presents for evaluation of abdominal pain. Patient enodorses several weeks of epigastric abdominal pain that she describes as dull and constant non radiating and associated with diffuse intermittent crampy abdominal pain. Patient endorses several daily episodes of watery diarrhea and nonbloody nonbilious emesis. No weight loss. Normal appetite. No fever or chills. No history of C. difficile. No personal or family history of IBS or IBD. No melena, hematemesis, coffee-ground emesis, hematochezia. Patient was seen at [facility] last week and was given Pepcid with no improvement of her symptoms. She denies dysuria, hematuria, chest pain, shortness of breath, vaginal discharge.  Past Medical History:  Diagnosis Date  . Breast mass   . Sciatic pain   . SOB (shortness of breath) on exertion     Patient Active Problem List   Diagnosis Date Noted  . Indication for care in labor or delivery 03/08/2016  . Pregnant and not yet delivered 12/31/2015  . Syncope 12/23/2015  . Anemia in pregnancy 12/23/2015  . First trimester screening 09/07/2015  . Flu vaccine need 12/07/2014  . Leg swelling in pregnancy 11/22/2014  . Pregnancy 11/21/2014  . Supervision of normal pregnancy 09/13/2014  . Sciatic pain 09/12/2014    Past Surgical History:  Procedure Laterality Date  . EYE SURGERY      Prior to Admission medications   Medication Sig Start Date End Date Taking? Authorizing Provider  dicyclomine (BENTYL) 10 MG capsule Take 1 capsule (10 mg total) by mouth 4 (four) times daily. 08/13/16 08/27/16  Nita Sickle, MD  ondansetron (ZOFRAN ODT) 4 MG disintegrating  tablet Take 1 tablet (4 mg total) by mouth every 8 (eight) hours as needed for nausea or vomiting. 08/13/16   Nita Sickle, MD  Prenatal Vit-Fe Fumarate-FA (MULTIVITAMIN-PRENATAL) 27-0.8 MG TABS tablet Take 1 tablet by mouth daily at 12 noon.    [provider]    Allergies Patient has no known allergies.  Family History  Problem Relation Age of Onset  . Hypertension Mother     Social History Social History  Substance Use Topics  . Smoking status: Never Smoker  . Smokeless tobacco: Never Used  . Alcohol use No    Review of Systems  Constitutional: Negative for fever. Eyes: Negative for visual changes. ENT: Negative for sore throat. Neck: No neck pain  Cardiovascular: Negative for chest pain. Respiratory: Negative for shortness of breath. Gastrointestinal: + epigastric abdominal pain, vomiting and diarrhea. Genitourinary: Negative for dysuria. Musculoskeletal: Negative for back pain. Skin: Negative for rash. Neurological: Negative for headaches, weakness or numbness. Psych: No SI or HI  ____________________________________________   PHYSICAL EXAM:  VITAL SIGNS: ED Triage Vitals [08/13/16 1102]  Enc Vitals Group     BP 100/63     Pulse Rate 99     Resp 18     Temp 98 F (36.7 C)     Temp Source Oral     SpO2 100 %     Weight      Height      Head Circumference      Peak Flow      Pain Score 10     Pain Loc      Pain Edu?  Excl. in GC?     Constitutional: Alert and oriented. Well appearing and in no apparent distress. HEENT:      Head: Normocephalic and atraumatic.         Eyes: Conjunctivae are normal. Sclera is non-icteric. EOMI. PERRL      Mouth/Throat: Mucous membranes are moist.       Neck: Supple with no signs of meningismus. Cardiovascular: Regular rate and rhythm. No murmurs, gallops, or rubs. 2+ symmetrical distal pulses are present in all extremities. No JVD. Respiratory: Normal respiratory effort. Lungs are clear to  auscultation bilaterally. No wheezes, crackles, or rhonchi.  Gastrointestinal: Soft, mild epigastric ttp, and non distended with positive bowel sounds. No rebound or guarding. Genitourinary: No CVA tenderness. Musculoskeletal: Nontender with normal range of motion in all extremities. No edema, cyanosis, or erythema of extremities. Neurologic: Normal speech and language. Face is symmetric. Moving all extremities. No gross focal neurologic deficits are appreciated. Skin: Skin is warm, dry and intact. No rash noted. Psychiatric: Mood and affect are normal. Speech and behavior are normal.  ____________________________________________   LABS (all labs ordered are listed, but only abnormal results are displayed)  Labs Reviewed  COMPREHENSIVE METABOLIC PANEL - Abnormal; Notable for the following:       Result Value   Glucose, Bld 101 (*)    AST 12 (*)    ALT 10 (*)    All other components within normal limits  URINALYSIS, COMPLETE (UACMP) WITH MICROSCOPIC - Abnormal; Notable for the following:    Color, Urine YELLOW (*)    APPearance HAZY (*)    Squamous Epithelial / LPF 6-30 (*)    All other components within normal limits  LIPASE, BLOOD  CBC  POC URINE PREG, ED  POCT PREGNANCY, URINE   ____________________________________________  EKG  none ____________________________________________  RADIOLOGY  none  ____________________________________________   PROCEDURES  Procedure(s) performed: None Procedures Critical Care performed:  None ____________________________________________   INITIAL IMPRESSION / ASSESSMENT AND PLAN / ED COURSE  26 y.o. female no significant past medical history who presents for evaluation of several weeks of epigastric dull constant abdominal pain associated with diffuse cramping, watery diarrhea, and NBNB emesis. Patient is extremely well appearing, smiling, playing with her 3 daughters were in the room, her vital signs are within normal, her abdomen  is soft with mild tenderness in the epigastric region, no rebound or guarding. Her blood work shows no acute findings. I believe the patient's symptoms are consistent with irritable bowel syndrome versus possible infection. There is no evidence of acute cholecystitis or appendicitis based on history, labs, and exam. I do not believe the patient will benefit from a CT of her abdomen at this time since her symptoms have been ongoing for multiple weeks. I will discharge her home on Bentyl and Zofran and we'll refer her to GI for further management. Recommended she return to the emergency room if the pain localizes to the right side either upper or lower quadrant or she has any new acute symptoms. Patient is comfortable with this plan.     Pertinent labs & imaging results that were available during my care of the patient were reviewed by me and considered in my medical decision making (see chart for details).    ____________________________________________   FINAL CLINICAL IMPRESSION(S) / ED DIAGNOSES  Final diagnoses:  Epigastric pain  Vomiting and diarrhea      NEW MEDICATIONS STARTED DURING THIS VISIT:  New Prescriptions   DICYCLOMINE (BENTYL) 10 MG CAPSULE  Take 1 capsule (10 mg total) by mouth 4 (four) times daily.   ONDANSETRON (ZOFRAN ODT) 4 MG DISINTEGRATING TABLET    Take 1 tablet (4 mg total) by mouth every 8 (eight) hours as needed for nausea or vomiting.     Note:  This document was prepared using Dragon voice recognition software and may include unintentional dictation errors.    Don Perking, [place], MD 08/13/16 [phone]

## 2016-08-13 NOTE — Discharge Instructions (Signed)

## 2016-08-13 NOTE — ED Notes (Signed)
Recurrent abdominal pain X 2 weeks. NVD. [facility] and no better. Pt alert and oriented X4, active, cooperative, pt in NAD. RR even and unlabored, color WNL.

## 2016-08-13 NOTE — ED Notes (Signed)
Pt awaiting EDP assessment. Explained reason for delay. Pt understands. Family at bedside.

## 2016-08-13 NOTE — ED Notes (Signed)
Pt alert and oriented X4, active, cooperative, pt in NAD. RR even and unlabored, color WNL.  Pt informed to return if any life threatening symptoms occur.   

## 2018-01-05 ENCOUNTER — Encounter: Payer: Self-pay | Admitting: Emergency Medicine

## 2018-01-05 ENCOUNTER — Emergency Department
Admission: EM | Admit: 2018-01-05 | Discharge: 2018-01-05 | Disposition: A | Discharge status: Home / Self Care | Payer: Self-pay | Attending: Emergency Medicine | Admitting: Emergency Medicine

## 2018-01-05 ENCOUNTER — Emergency Department: Payer: Self-pay

## 2018-01-05 ENCOUNTER — Other Ambulatory Visit: Payer: Self-pay

## 2018-01-05 DIAGNOSIS — G43819 Other migraine, intractable, without status migrainosus: Secondary | ICD-10-CM | POA: Insufficient documentation

## 2018-01-05 DIAGNOSIS — Z79899 Other long term (current) drug therapy: Secondary | ICD-10-CM | POA: Insufficient documentation

## 2018-01-05 MED ORDER — BUTALBITAL-APAP-CAFFEINE 50-325-40 MG PO TABS: 1.0000 | ORAL_TABLET | Freq: Four times a day (QID) | ORAL | 0 refills | Status: AC | PRN

## 2018-01-05 MED ORDER — SUMATRIPTAN SUCCINATE 6 MG/0.5ML ~~LOC~~ SOLN
6.0000 mg | Freq: Once | SUBCUTANEOUS | Status: AC
  Administered 2018-01-05: 6 mg via SUBCUTANEOUS
  Filled 2018-01-05: qty 0.5

## 2018-01-05 MED ORDER — PROMETHAZINE HCL 25 MG/ML IJ SOLN
12.5000 mg | Freq: Once | INTRAMUSCULAR | Status: AC
  Administered 2018-01-05: 12.5 mg via INTRAVENOUS
  Filled 2018-01-05: qty 1

## 2018-01-05 MED ORDER — SODIUM CHLORIDE 0.9 % IV BOLUS
1000.0000 mL | Freq: Once | INTRAVENOUS | Status: AC
  Administered 2018-01-05: 1000 mL via INTRAVENOUS

## 2018-01-05 MED ORDER — KETOROLAC TROMETHAMINE 30 MG/ML IJ SOLN
30.0000 mg | Freq: Once | INTRAMUSCULAR | Status: AC
  Administered 2018-01-05: 30 mg via INTRAMUSCULAR
  Filled 2018-01-05: qty 1

## 2018-01-05 NOTE — ED Provider Notes (Signed)
[facility] Emergency Department Provider Note  ____________________________________________  Time seen: Approximately 8:06 AM  I have reviewed the triage vital signs and the nursing notes.   HISTORY  Chief Complaint Headache    HPI Krystal Richard is a 27 y.o. female that presents to the emergency department for evaluation of migraine and light sensitivity for 3 days. Migraine is primarily over the front of her head.  Migraine feels similar to previous migraines but this 1 has lasted longer than usual.  She has a history of migraines with aura. Usually she takes [facility] powders which resolves her symptoms. She has taken [facility] powders this time without relief. No fever, sinusitis, neck pain, nausea, vomiting.    Past Medical History:  Diagnosis Date  . Breast mass   . Sciatic pain   . SOB (shortness of breath) on exertion     Patient Active Problem List   Diagnosis Date Noted  . Indication for care in labor or delivery 03/08/2016  . Pregnant and not yet delivered 12/31/2015  . Syncope 12/23/2015  . Anemia in pregnancy 12/23/2015  . First trimester screening 09/07/2015  . Flu vaccine need 12/07/2014  . Leg swelling in pregnancy 11/22/2014  . Pregnancy 11/21/2014  . Supervision of normal pregnancy 09/13/2014  . Sciatic pain 09/12/2014    Past Surgical History:  Procedure Laterality Date  . EYE SURGERY      Prior to Admission medications   Medication Sig Start Date End Date Taking? Authorizing Provider  butalbital-acetaminophen-caffeine (FIORICET, ESGIC) 50-325-40 MG tablet Take 1 tablet by mouth every 6 (six) hours as needed for headache. 01/05/18 01/05/19  Enid Derry, PA-C  dicyclomine (BENTYL) 10 MG capsule Take 1 capsule (10 mg total) by mouth 4 (four) times daily. 08/13/16 08/27/16  Nita Sickle, MD  ondansetron (ZOFRAN ODT) 4 MG disintegrating tablet Take 1 tablet (4 mg total) by mouth every 8 (eight) hours as needed for nausea or vomiting.  08/13/16   Nita Sickle, MD  Prenatal Vit-Fe Fumarate-FA (MULTIVITAMIN-PRENATAL) 27-0.8 MG TABS tablet Take 1 tablet by mouth daily at 12 noon.    [provider]    Allergies Patient has no known allergies.  Family History  Problem Relation Age of Onset  . Hypertension Mother     Social History Social History   Tobacco Use  . Smoking status: Never Smoker  . Smokeless tobacco: Never Used  Substance Use Topics  . Alcohol use: No  . Drug use: No     Review of Systems  Constitutional: No fever/chills Cardiovascular: No chest pain. Respiratory: No SOB. Gastrointestinal: No nausea, no vomiting.  Musculoskeletal: Negative for musculoskeletal pain. Skin: Negative for rash, abrasions, lacerations, ecchymosis. Neurological: Negative for numbness or tingling. Positive for headache.    ____________________________________________   PHYSICAL EXAM:  VITAL SIGNS: ED Triage Vitals  Enc Vitals Group     BP 01/05/18 0734 120/69     Pulse Rate 01/05/18 0734 (!) 110     Resp 01/05/18 0734 20     Temp 01/05/18 0734 98.3 F (36.8 C)     Temp Source 01/05/18 0734 Oral     SpO2 01/05/18 0734 98 %     Weight 01/05/18 0735 155 lb (70.3 kg)     Height 01/05/18 0735 5\' 6"  (1.676 m)     Head Circumference --      Peak Flow --      Pain Score 01/05/18 0734 10     Pain Loc --  Pain Edu? --      Excl. in GC? --      Constitutional: Alert and oriented. Well appearing and in no acute distress. Eyes: Conjunctivae are normal. PERRL. EOMI. Head: Atraumatic. ENT:      Ears:      Nose: No congestion/rhinnorhea.      Mouth/Throat: Mucous membranes are moist.  Neck: No stridor.  Cardiovascular: Normal rate, regular rhythm.  Good peripheral circulation. Respiratory: Normal respiratory effort without tachypnea or retractions. Lungs CTAB. Good air entry to the bases with no decreased or absent breath sounds. Gastrointestinal: Bowel sounds 4 quadrants. Soft and nontender  to palpation. No guarding or rigidity. No palpable masses. No distention.  Musculoskeletal: Full range of motion to all extremities. No gross deformities appreciated. Neurologic: Normal speech and language. No gross focal neurologic deficits are appreciated.  Cranial nerves: 2-10 normal as tested. Strength 5/5 in upper and lower extremities Skin:  Skin is warm, dry and intact. No rash noted. Psychiatric: Mood and affect are normal. Speech and behavior are normal. Patient exhibits appropriate insight and judgement.   ____________________________________________   LABS (all labs ordered are listed, but only abnormal results are displayed)  Labs Reviewed - No data to display ____________________________________________  EKG   ____________________________________________  RADIOLOGY Lexine Baton, personally viewed and evaluated these images (plain radiographs) as part of my medical decision making, as well as reviewing the written report by the radiologist.  Ct Head Wo Contrast  Result Date: 01/05/2018 CLINICAL DATA:  See triage note Presents with pain to both temporal areas And occasionally to top of headache States she has taken OTC meds with min to no relief Denies any fever ,n/v or trauma Placed in dark room. HX migraines. NCP. TKV EXAM: CT HEAD WITHOUT CONTRAST TECHNIQUE: Contiguous axial images were obtained from the base of the skull through the vertex without intravenous contrast. COMPARISON:  04/05/2014 FINDINGS: Brain: No evidence of acute infarction, hemorrhage, hydrocephalus, extra-axial collection or mass lesion/mass effect. Vascular: No hyperdense vessel or unexpected calcification. Skull: Normal. Negative for fracture or focal lesion. Sinuses/Orbits: No acute finding. Other: None. IMPRESSION: Negative exam. Electronically Signed   By: Norva Pavlov M.D.   On: 01/05/2018 08:58    ____________________________________________    PROCEDURES  Procedure(s) performed:     Procedures    Medications  sodium chloride 0.9 % bolus 1,000 mL (0 mLs Intravenous Stopped 01/05/18 1054)  ketorolac (TORADOL) 30 MG/ML injection 30 mg (30 mg Intramuscular Given 01/05/18 0946)  promethazine (PHENERGAN) injection 12.5 mg (12.5 mg Intravenous Given 01/05/18 0942)  SUMAtriptan (IMITREX) injection 6 mg (6 mg Subcutaneous Given 01/05/18 1045)     ____________________________________________   INITIAL IMPRESSION / ASSESSMENT AND PLAN / ED COURSE  Pertinent labs & imaging results that were available during my care of the patient were reviewed by me and considered in my medical decision making (see chart for details).  Review of the [place] CSRS was performed in accordance of the [facility] prior to dispensing any controlled drugs.     Patient presented to the emergency department for evaluation of migraine for 3 days.  Vital signs and exam are reassuring.  Neuro exam is unremarkable.  CT head negative for acute abnormalities.  Pain improved with Toradol and Phenergan but had not resolved.  Headache resolved with Imitrex.  Patient feels well and comfortable to go home.  Patient will be discharged home with prescriptions for Fioricet. Patient is to follow up with primary care or neuro as directed. Patient is  given ED precautions to return to the ED for any worsening or new symptoms.     ____________________________________________  FINAL CLINICAL IMPRESSION(S) / ED DIAGNOSES  Final diagnoses:  Other migraine without status migrainosus, intractable      NEW MEDICATIONS STARTED DURING THIS VISIT:  ED Discharge Orders         Ordered    butalbital-acetaminophen-caffeine (FIORICET, ESGIC) 50-325-40 MG tablet  Every 6 hours PRN     01/05/18 1136              This chart was dictated using voice recognition software/Dragon. Despite best efforts to proofread, errors can occur which can change the meaning. Any change was purely unintentional.    Enid Derry,  PA-C 01/05/18 1545    Jene Every, MD 01/06/18 [phone]

## 2018-01-05 NOTE — ED Triage Notes (Signed)
Bilateral frontal headache x 3 days. Worse when leans over. Denies head injury. Denies use of blood thinners.

## 2018-01-05 NOTE — ED Notes (Signed)
See triage note  Presents with pain to both temporal areas  And occasionally to top of headache  States she has taken OTC meds with min to no relief  Denies any fever ,n/v or trauma  Placed in dark room

## 2018-01-05 NOTE — ED Notes (Signed)
PAtient ambulated out with no problems

## 2018-02-05 IMAGING — US US OB COMP LESS 14 WK
1 series · 14 of 28 positions shown · non-contrast
Comparison: None.

CLINICAL DATA: Pregnant patient in first-trimester pregnancy with
pelvic pain. Pain for 4 days. Beta HCG 77898

EXAM:
OBSTETRIC <14 WK US AND TRANSVAGINAL OB US
TECHNIQUE: Both transabdominal and transvaginal ultrasound examinations were
performed for complete evaluation of the gestation as well as the
maternal uterus, adnexal regions, and pelvic cul-de-sac.
Transvaginal technique was performed to assess early pregnancy.

[Series 1: us ob comp less 14 wk · 0.19mm/px · 14 of 59 slices shown]
[im 3/59]
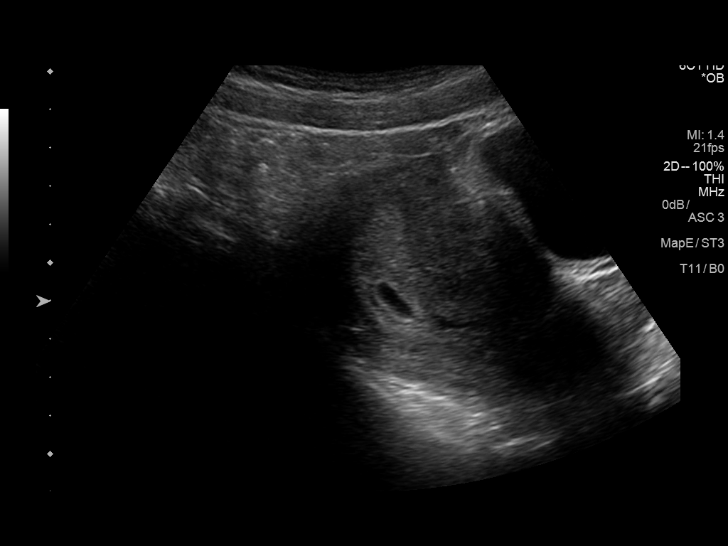
[im 7/59]
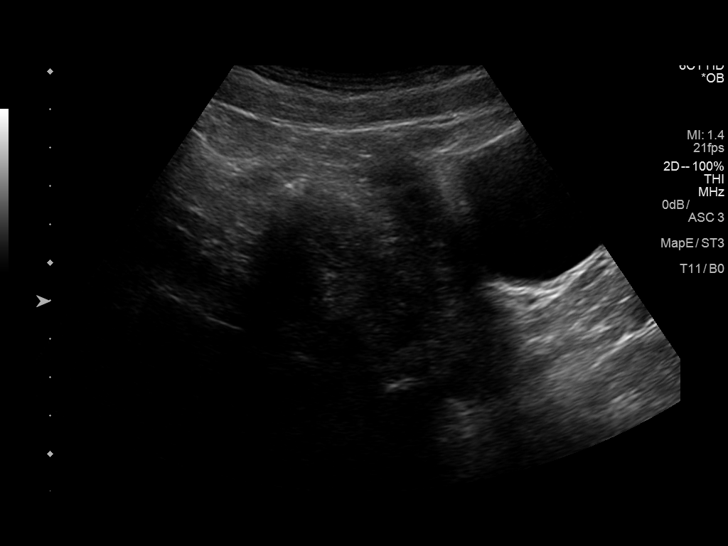
[im 11/59]
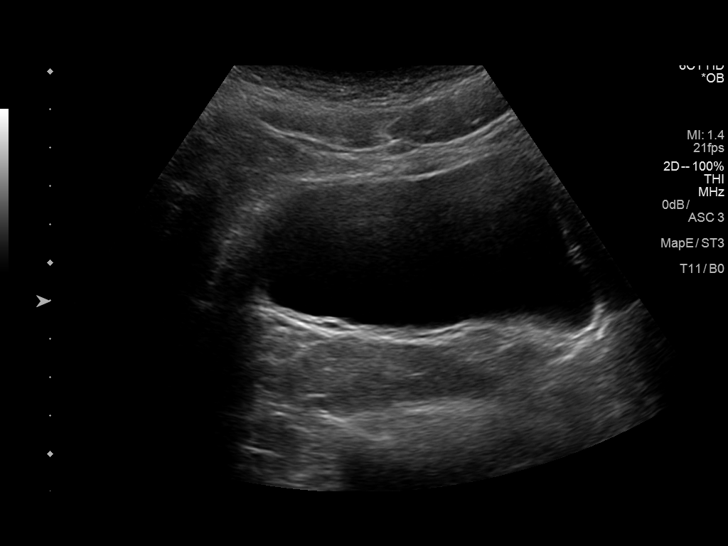
[im 16/59]
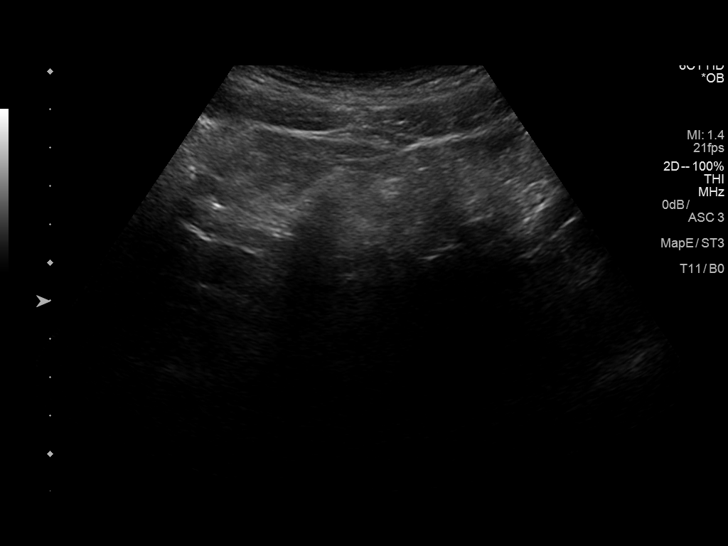
[im 20/59]
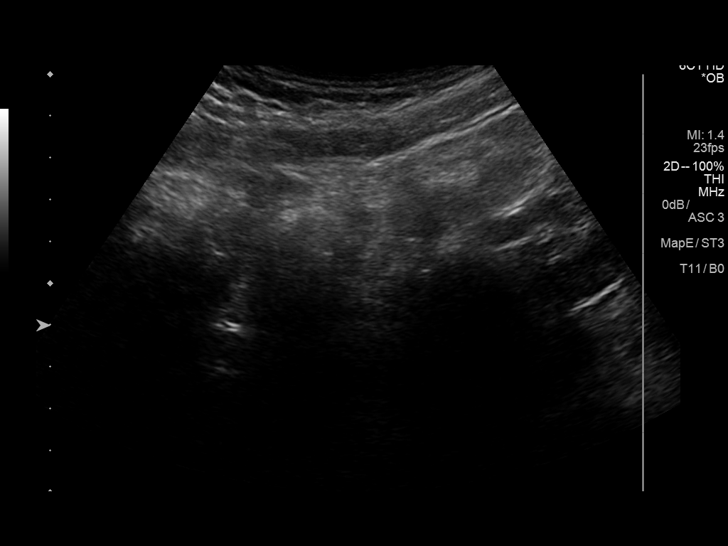
[im 24/59]
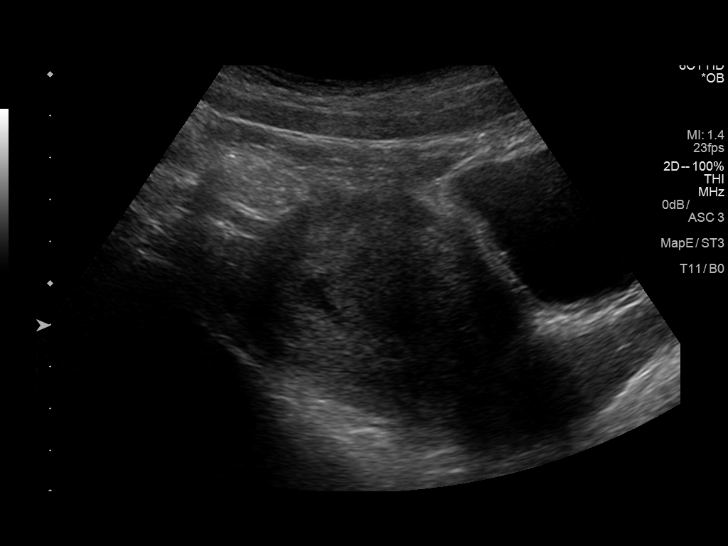
[im 28/59]
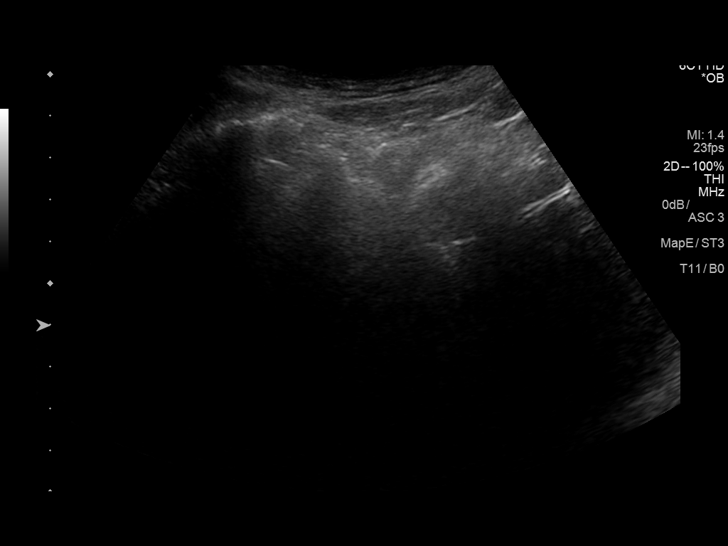
[im 33/59]
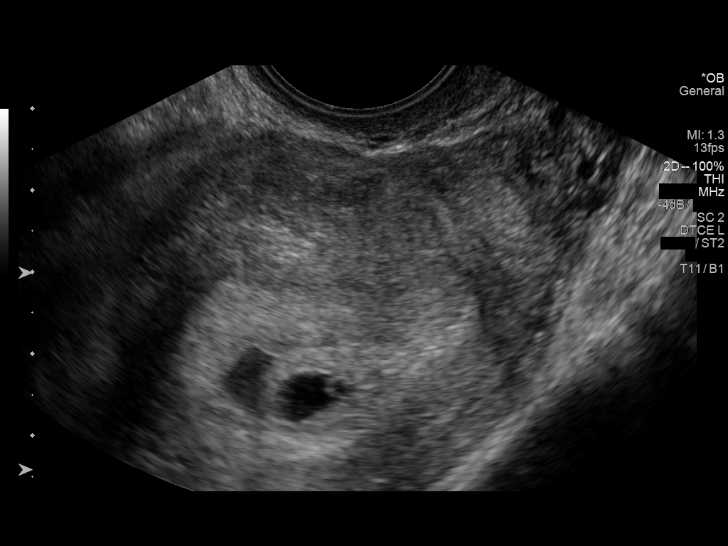
[im 37/59]
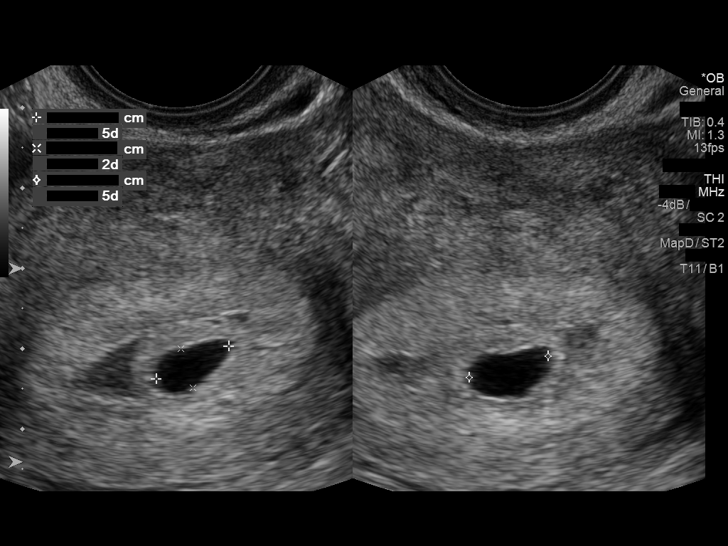
[im 41/59]
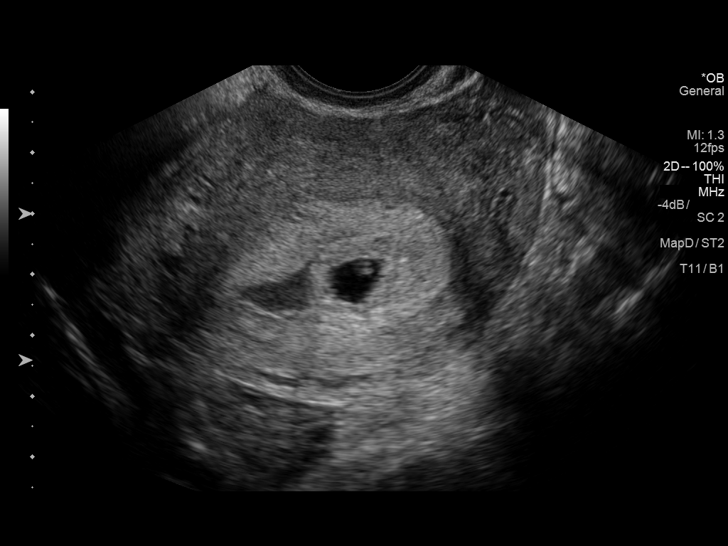
[im 46/59]
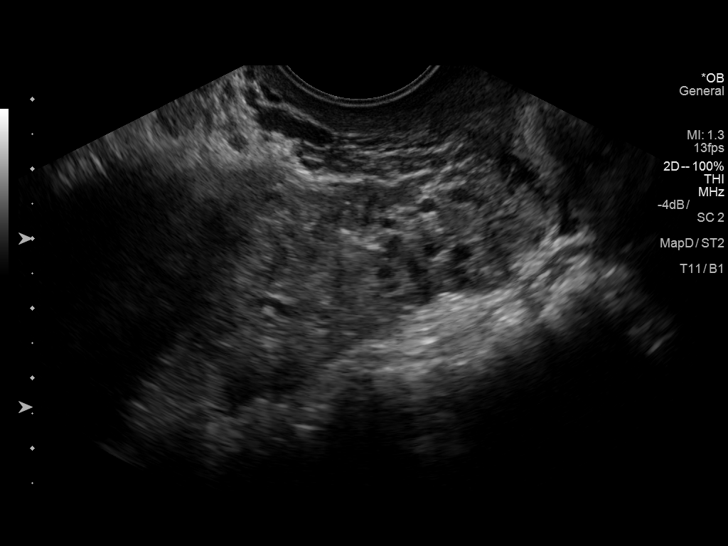
[im 50/59]
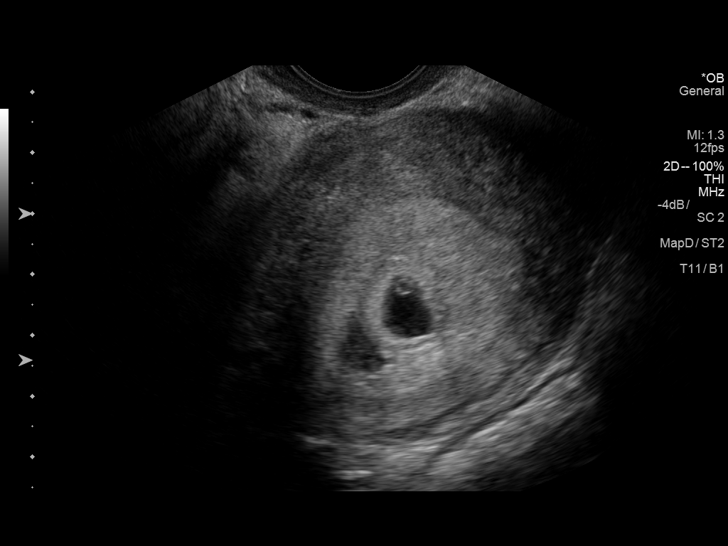
[im 54/59]
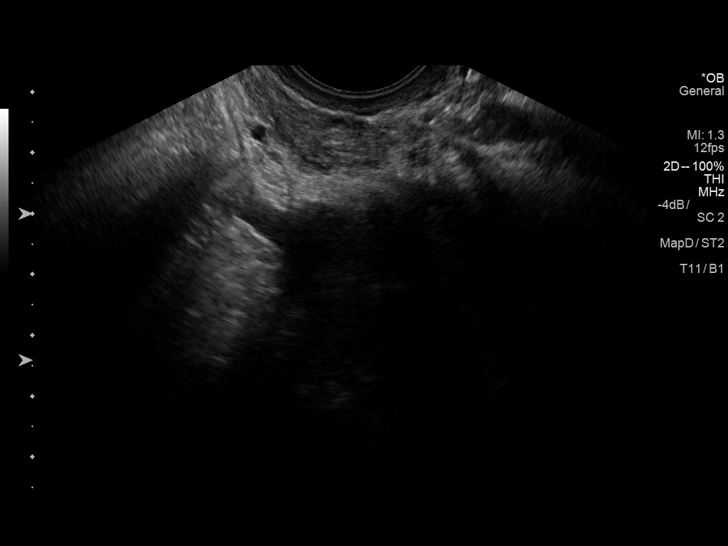
[im 59/59]
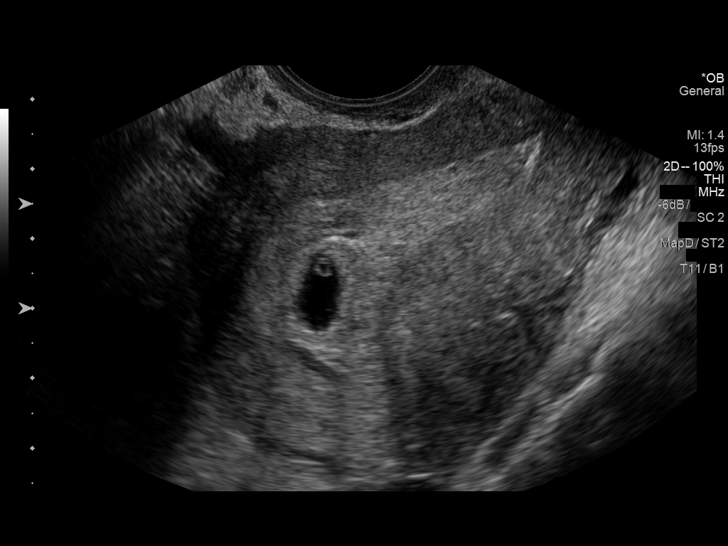

[14 of 28 positions shown; findings below may reference images not displayed]

FINDINGS: Intrauterine gestational sac: Present.

Yolk sac:  Present.

Embryo:  Not present.

Cardiac Activity: Not present.

MSD: 8.4  mm   5 w   4  d

Subchorionic hemorrhage:  Small, measures 1.7 x 0.4 cm.

Maternal uterus/adnexae: Both ovaries are identified and normal. No
pelvic free fluid.
IMPRESSION: Intrauterine gestational sac at 5 week 4 day by mean sac diameter
containing yolk sac, however no fetal pole or cardiac activity.
Small subchorionic hemorrhage. Recommend trending of beta HCG.
Sonographic follow-up recommended in 10 days as indicated.

## 2018-06-10 ENCOUNTER — Other Ambulatory Visit: Payer: Self-pay

## 2018-06-10 ENCOUNTER — Encounter: Payer: Self-pay | Admitting: Emergency Medicine

## 2018-06-10 ENCOUNTER — Emergency Department: Payer: Medicaid Other

## 2018-06-10 ENCOUNTER — Emergency Department
Admission: EM | Admit: 2018-06-10 | Discharge: 2018-06-10 | Disposition: A | Discharge status: Home / Self Care | Payer: Medicaid Other | Attending: Emergency Medicine | Admitting: Emergency Medicine

## 2018-06-10 DIAGNOSIS — J0100 Acute maxillary sinusitis, unspecified: Secondary | ICD-10-CM | POA: Insufficient documentation

## 2018-06-10 DIAGNOSIS — Z79899 Other long term (current) drug therapy: Secondary | ICD-10-CM | POA: Insufficient documentation

## 2018-06-10 MED ORDER — AMOXICILLIN-POT CLAVULANATE 250-62.5 MG/5ML PO SUSR: 500.0000 mg | Freq: Three times a day (TID) | ORAL | 0 refills | Status: AC

## 2018-06-10 MED ORDER — AMOXICILLIN-POT CLAVULANATE 875-125 MG PO TABS
1.0000 | ORAL_TABLET | Freq: Once | ORAL | Status: AC
  Administered 2018-06-10: 1 via ORAL
  Filled 2018-06-10: qty 1

## 2018-06-10 MED ORDER — BENZONATATE 100 MG PO CAPS: 100.0000 mg | ORAL_CAPSULE | Freq: Three times a day (TID) | ORAL | 0 refills | Status: DC | PRN

## 2018-06-10 MED ORDER — HYDROCOD POLST-CPM POLST ER 10-8 MG/5ML PO SUER
5.0000 mL | Freq: Once | ORAL | Status: AC
Start: 2018-06-10 — End: 2018-06-10
  Administered 2018-06-10: 5 mL via ORAL
  Filled 2018-06-10: qty 5

## 2018-06-10 NOTE — ED Triage Notes (Signed)
Pt arrived to the E for complaints of fever for the last month on and off. Pt reports that the cough has been intermittent but for the last 3 days it getting progressively worse making it difficult to sleep. Pt is AOx4 in no apparent distress.

## 2018-06-10 NOTE — ED Provider Notes (Signed)
[facility] Emergency Department Provider Note    First MD Initiated Contact with Patient 06/10/18 [phone]     (approximate)  I have reviewed the triage vital signs and the nursing notes.   HISTORY  Chief Complaint Cough    HPI Krystal Richard is a 28 y.o. female presents to the emergency department with nasal congestion cough rhinorrhea facial discomfort x1 month intermittently however patient states symptoms have been persistent over the past 3 days.  Patient denies any fever afebrile on presentation.  Patient denies any tobacco use.        Past Medical History:  Diagnosis Date  . Breast mass   . Sciatic pain   . SOB (shortness of breath) on exertion     Patient Active Problem List   Diagnosis Date Noted  . Indication for care in labor or delivery 03/08/2016  . Pregnant and not yet delivered 12/31/2015  . Syncope 12/23/2015  . Anemia in pregnancy 12/23/2015  . First trimester screening 09/07/2015  . Flu vaccine need 12/07/2014  . Leg swelling in pregnancy 11/22/2014  . Pregnancy 11/21/2014  . Supervision of normal pregnancy 09/13/2014  . Sciatic pain 09/12/2014    Past Surgical History:  Procedure Laterality Date  . EYE SURGERY      Prior to Admission medications   Medication Sig Start Date End Date Taking? Authorizing Provider  amoxicillin-clavulanate (AUGMENTIN) 250-62.5 MG/5ML suspension Take 10 mLs (500 mg total) by mouth 3 (three) times daily for 10 days. 06/10/18 06/20/18  Darci Current, MD  benzonatate (TESSALON PERLES) 100 MG capsule Take 1 capsule (100 mg total) by mouth 3 (three) times daily as needed for cough. 06/10/18   Darci Current, MD  butalbital-acetaminophen-caffeine (FIORICET, ESGIC) [phone] MG tablet Take 1 tablet by mouth every 6 (six) hours as needed for headache. 01/05/18 01/05/19  Enid Derry, PA-C  dicyclomine (BENTYL) 10 MG capsule Take 1 capsule (10 mg total) by mouth 4 (four) times daily. 08/13/16  08/27/16  Nita Sickle, MD  ondansetron (ZOFRAN ODT) 4 MG disintegrating tablet Take 1 tablet (4 mg total) by mouth every 8 (eight) hours as needed for nausea or vomiting. 08/13/16   Nita Sickle, MD  Prenatal Vit-Fe Fumarate-FA (MULTIVITAMIN-PRENATAL) 27-0.8 MG TABS tablet Take 1 tablet by mouth daily at 12 noon.    [provider]    Allergies Patient has no known allergies.  Family History  Problem Relation Age of Onset  . Hypertension Mother     Social History Social History   Tobacco Use  . Smoking status: Never Smoker  . Smokeless tobacco: Never Used  Substance Use Topics  . Alcohol use: No  . Drug use: No    Review of Systems Constitutional: Positive for fever Eyes: No visual changes. ENT: No sore throat.  Positive for nasal congestion Cardiovascular: Denies chest pain.   Respiratory: Denies shortness of breath.  Positive for cough Gastrointestinal: No abdominal pain.  No nausea, no vomiting.  No diarrhea.  No constipation. Genitourinary: Negative for dysuria. Musculoskeletal: Negative for neck pain.  Negative for back pain. Integumentary: Negative for rash. Neurological: Negative for headaches, focal weakness or numbness.   ____________________________________________   PHYSICAL EXAM:  VITAL SIGNS: ED Triage Vitals  Enc Vitals Group     BP 06/10/18 0352 112/77     Pulse Rate 06/10/18 0039 (!) 117     Resp 06/10/18 0039 18     Temp 06/10/18 0039 98.7 F (37.1 C)  Temp Source 06/10/18 0039 Oral     SpO2 06/10/18 0039 97 %     Weight 06/10/18 0040 70.3 kg (155 lb)     Height 06/10/18 0040 1.676 m (5\' 6" )     Head Circumference --      Peak Flow --      Pain Score 06/10/18 0039 0     Pain Loc --      Pain Edu? --      Excl. in [place]? --     Constitutional: Alert and oriented. Well appearing and in no acute distress. Eyes: Conjunctivae are normal. Ears:  Healthy appearing ear canals and TMs bilaterally Nose: Positive for  congestion and clear rhinnorhea. Mouth/Throat: Mucous membranes are moist.  Oropharynx non-erythematous. Neck: No stridor.   Cardiovascular: Normal rate, regular rhythm. Good peripheral circulation. Grossly normal heart sounds. Respiratory: Normal respiratory effort.  No retractions. Lungs CTAB. Gastrointestinal: Soft and nontender. No distention.  Musculoskeletal: No lower extremity tenderness nor edema. No gross deformities of extremities. Neurologic:  Normal speech and language. No gross focal neurologic deficits are appreciated.  Skin:  Skin is warm, dry and intact. No rash noted.   ______________________________  RADIOLOGY I, Darci Current, personally viewed and evaluated these images (plain radiographs) as part of my medical decision making, as well as reviewing the written report by the radiologist.  ED MD interpretation: No active cardiopulmonary disease noted on chest x-ray per radiologist.  Official radiology report(s): Dg Chest 2 View  Result Date: 06/10/2018 CLINICAL DATA:  Fever for the last month on and off. Cough for 3 days. EXAM: CHEST - 2 VIEW COMPARISON:  03/01/2013 FINDINGS: The heart size and mediastinal contours are within normal limits. Both lungs are clear. The visualized skeletal structures are unremarkable. IMPRESSION: No active cardiopulmonary disease. Electronically Signed   By: Burman Nieves M.D.   On: 06/10/2018 01:01     Procedures   ____________________________________________   INITIAL IMPRESSION / MDM / ASSESSMENT AND PLAN / ED COURSE  As part of my medical decision making, I reviewed the following data within the electronic MEDICAL RECORD NUMBER   28 year old female presenting with above-stated history and physical exam consistent with sinusitis.  Chest x-ray performed to evaluate for bronchitis and pneumonia however chest x-ray was unremarkable.  Patient given Augmentin in the emergency department will be prescribed same for home.  Patient also  given Tessalon Perles.    ____________________________________________  FINAL CLINICAL IMPRESSION(S) / ED DIAGNOSES  Final diagnoses:  Acute maxillary sinusitis, recurrence not specified     MEDICATIONS GIVEN DURING THIS VISIT:  Medications  amoxicillin-clavulanate (AUGMENTIN) 875-125 MG per tablet 1 tablet (1 tablet Oral Given 06/10/18 0349)  chlorpheniramine-HYDROcodone (TUSSIONEX) 10-8 MG/5ML suspension 5 mL (5 mLs Oral Given 06/10/18 0349)     ED Discharge Orders         Ordered    amoxicillin-clavulanate (AUGMENTIN) 250-62.5 MG/5ML suspension  3 times daily     06/10/18 0358    benzonatate (TESSALON PERLES) 100 MG capsule  3 times daily PRN     06/10/18 0358           Note:  This document was prepared using Dragon voice recognition software and may include unintentional dictation errors.   Darci Current, MD 06/10/18 [phone]

## 2019-03-03 ENCOUNTER — Emergency Department
Admission: EM | Admit: 2019-03-03 | Discharge: 2019-03-03 | Disposition: A | Discharge status: Home / Self Care | Payer: Medicaid Other | Attending: Emergency Medicine | Admitting: Emergency Medicine

## 2019-03-03 ENCOUNTER — Other Ambulatory Visit: Payer: Self-pay

## 2019-03-03 ENCOUNTER — Emergency Department: Payer: Medicaid Other

## 2019-03-03 DIAGNOSIS — R0981 Nasal congestion: Secondary | ICD-10-CM | POA: Insufficient documentation

## 2019-03-03 DIAGNOSIS — J189 Pneumonia, unspecified organism: Secondary | ICD-10-CM | POA: Insufficient documentation

## 2019-03-03 DIAGNOSIS — M545 Low back pain, unspecified: Secondary | ICD-10-CM

## 2019-03-03 DIAGNOSIS — R509 Fever, unspecified: Secondary | ICD-10-CM | POA: Insufficient documentation

## 2019-03-03 LAB — URINALYSIS, COMPLETE (UACMP) WITH MICROSCOPIC
Bacteria, UA: NONE SEEN
Bilirubin Urine: NEGATIVE
Glucose, UA: NEGATIVE mg/dL
Hgb urine dipstick: NEGATIVE
Ketones, ur: 20 mg/dL — AB
Leukocytes,Ua: NEGATIVE
Nitrite: NEGATIVE
Protein, ur: 30 mg/dL — AB
Specific Gravity, Urine: 1.026 (ref 1.005–1.030)
pH: 7 (ref 5.0–8.0)

## 2019-03-03 LAB — CBC
HCT: 37.7 % (ref 36.0–46.0)
Hemoglobin: 12.2 g/dL (ref 12.0–15.0)
MCH: 27.3 pg (ref 26.0–34.0)
MCHC: 32.4 g/dL (ref 30.0–36.0)
MCV: 84.3 fL (ref 80.0–100.0)
Platelets: 249 10*3/uL (ref 150–400)
RBC: 4.47 MIL/uL (ref 3.87–5.11)
RDW: 12.5 % (ref 11.5–15.5)
WBC: 5.7 10*3/uL (ref 4.0–10.5)
nRBC: 0 % (ref 0.0–0.2)

## 2019-03-03 LAB — BASIC METABOLIC PANEL
Anion gap: 11 (ref 5–15)
BUN: 7 mg/dL (ref 6–20)
CO2: 25 mmol/L (ref 22–32)
Calcium: 9.3 mg/dL (ref 8.9–10.3)
Chloride: 100 mmol/L (ref 98–111)
Creatinine, Ser: 0.55 mg/dL (ref 0.44–1.00)
GFR calc Af Amer: >60 mL/min (ref >60)
GFR calc non Af Amer: >60 mL/min (ref >60)
Glucose, Bld: 102 mg/dL — ABNORMAL HIGH (ref 70–99)
Potassium: 3.7 mmol/L (ref 3.5–5.1)
Sodium: 136 mmol/L (ref 135–145)

## 2019-03-03 LAB — TROPONIN I (HIGH SENSITIVITY): Troponin I (High Sensitivity): <2 ng/L (ref <18)

## 2019-03-03 LAB — POCT PREGNANCY, URINE: Preg Test, Ur: NEGATIVE

## 2019-03-03 MED ORDER — METOCLOPRAMIDE HCL 5 MG/ML IJ SOLN
10.0000 mg | Freq: Once | INTRAMUSCULAR | Status: AC
  Administered 2019-03-03: 15:00:00 10 mg via INTRAVENOUS
  Filled 2019-03-03: qty 2

## 2019-03-03 MED ORDER — SODIUM CHLORIDE 0.9% FLUSH: 3.0000 mL | Freq: Once | INTRAVENOUS | Status: DC

## 2019-03-03 MED ORDER — AZITHROMYCIN 500 MG PO TABS
500.0000 mg | ORAL_TABLET | Freq: Once | ORAL | Status: AC
  Administered 2019-03-03: 16:00:00 500 mg via ORAL
  Filled 2019-03-03: qty 1

## 2019-03-03 MED ORDER — AZITHROMYCIN 250 MG PO TABS: ORAL_TABLET | ORAL | 0 refills | Status: AC

## 2019-03-03 MED ORDER — SODIUM CHLORIDE 0.9 % IV BOLUS
1000.0000 mL | Freq: Once | INTRAVENOUS | Status: AC
Start: 2019-03-03 — End: 2019-03-03
  Administered 2019-03-03: 1000 mL via INTRAVENOUS

## 2019-03-03 MED ORDER — KETOROLAC TROMETHAMINE 30 MG/ML IJ SOLN
30.0000 mg | Freq: Once | INTRAMUSCULAR | Status: AC
  Administered 2019-03-03: 30 mg via INTRAVENOUS
  Filled 2019-03-03: qty 1

## 2019-03-03 MED ORDER — IBUPROFEN 600 MG PO TABS: 600.0000 mg | ORAL_TABLET | Freq: Four times a day (QID) | ORAL | 0 refills | Status: DC | PRN

## 2019-03-03 NOTE — Discharge Instructions (Signed)
Take the antibiotic as prescribed and finish the full course.  Return to the ER immediately for new, worsening, or persistent severe back pain, headache, high fever, weakness, shortness of breath, vomiting or if you cannot take the antibiotic, or any other new or worsening symptoms that concern you.

## 2019-03-03 NOTE — ED Triage Notes (Addendum)
Pt comes via pOV from home with c/o headache, eye pain that has been going on for about 4 days. Pt states she has taken medication with no relief.  Pt states recent COVID test and Flu and neg result on Nov 30th.   Pt denies any N/V/D.  Pt states abdominal pain and back pain.  Pt states pain in her eyes like they are pushing out. Pt also states CP and some blurred vision that started today. Pt states left sided CP.   Mini neuro negative

## 2019-03-03 NOTE — ED Notes (Signed)
Says she feels nauseated and hot after reglan.   Pain better.

## 2019-03-03 NOTE — ED Provider Notes (Signed)
[facility] Emergency Department Provider Note ____________________________________________   First MD Initiated Contact with Patient 03/03/19 1225     (approximate)  I have reviewed the triage vital signs and the nursing notes.   HISTORY  Chief Complaint Headache and Chest Pain    HPI Krystal Richard is a 28 y.o. female with PMH as noted below who presents with a headache, bilateral frontal in location, throbbing, and not associated with nausea or significant photophobia.  She also has some bilateral low back pain.  These have developed over the last day and are associated with a low-grade fever.  On further history, the patient reports nasal congestion that started several days ago, a productive cough, and left-sided chest pain over the last day.  She denies any vomiting or diarrhea and has no urinary symptoms.  The patient states that a coworker tested positive for COVID-19, so she got a Covid and flu test as an outpatient 2 days ago and both were negative.  She states her mother also had Covid last week but the patient did not have contact with her.   Past Medical History:  Diagnosis Date  . Breast mass   . Sciatic pain   . SOB (shortness of breath) on exertion     Patient Active Problem List   Diagnosis Date Noted  . Indication for care in labor or delivery 03/08/2016  . Pregnant and not yet delivered 12/31/2015  . Syncope 12/23/2015  . Anemia in pregnancy 12/23/2015  . First trimester screening 09/07/2015  . Flu vaccine need 12/07/2014  . Leg swelling in pregnancy 11/22/2014  . Pregnancy 11/21/2014  . Supervision of normal pregnancy 09/13/2014  . Sciatic pain 09/12/2014    Past Surgical History:  Procedure Laterality Date  . EYE SURGERY      Prior to Admission medications   Medication Sig Start Date End Date Taking? Authorizing Provider  azithromycin (ZITHROMAX Z-PAK) 250 MG tablet Take 2 tablets (500 mg) on  Day 1,  followed by 1  tablet (250 mg) once daily on Days 2 through 5. 03/03/19 03/08/19  Dionne Bucy, MD  benzonatate (TESSALON PERLES) 100 MG capsule Take 1 capsule (100 mg total) by mouth 3 (three) times daily as needed for cough. 06/10/18   Darci Current, MD  dicyclomine (BENTYL) 10 MG capsule Take 1 capsule (10 mg total) by mouth 4 (four) times daily. 08/13/16 08/27/16  Nita Sickle, MD  ibuprofen (ADVIL) 600 MG tablet Take 1 tablet (600 mg total) by mouth every 6 (six) hours as needed for fever or moderate pain. 03/03/19   Dionne Bucy, MD  ondansetron (ZOFRAN ODT) 4 MG disintegrating tablet Take 1 tablet (4 mg total) by mouth every 8 (eight) hours as needed for nausea or vomiting. 08/13/16   Nita Sickle, MD  Prenatal Vit-Fe Fumarate-FA (MULTIVITAMIN-PRENATAL) 27-0.8 MG TABS tablet Take 1 tablet by mouth daily at 12 noon.    [provider]    Allergies Patient has no known allergies.  Family History  Problem Relation Age of Onset  . Hypertension Mother     Social History Social History   Tobacco Use  . Smoking status: Never Smoker  . Smokeless tobacco: Never Used  Substance Use Topics  . Alcohol use: No  . Drug use: No    Review of Systems  Constitutional: Positive for fever. Eyes: No redness. ENT: Positive for nasal congestion. Cardiovascular: Positive for chest pain. Respiratory: Denies shortness of breath. Gastrointestinal: No vomiting or diarrhea.  Genitourinary: Negative for dysuria.  Musculoskeletal: Positive for back pain. Skin: Negative for rash. Neurological: Positive for headache.   ____________________________________________   PHYSICAL EXAM:  VITAL SIGNS: ED Triage Vitals  Enc Vitals Group     BP 03/03/19 1106 104/65     Pulse Rate 03/03/19 1106 (!) 134     Resp 03/03/19 1106 18     Temp 03/03/19 1106 100.2 F (37.9 C)     Temp Source 03/03/19 1106 Oral     SpO2 03/03/19 1106 100 %     Weight 03/03/19 1109 155 lb (70.3 kg)      Height 03/03/19 1109 5\' 6"  (1.676 m)     Head Circumference --      Peak Flow --      Pain Score 03/03/19 1114 10     Pain Loc --      Pain Edu? --      Excl. in [place]? --     Constitutional: Alert and oriented. Well appearing and in no acute distress. Eyes: Conjunctivae are normal.  Head: Atraumatic. Nose: No congestion/rhinnorhea. Mouth/Throat: Mucous membranes are moist.  Oropharynx clear with no erythema or exudates. Neck: Normal range of motion.  Cardiovascular: Tachycardic, regular rhythm. Grossly normal heart sounds.  Good peripheral circulation. Respiratory: Normal respiratory effort.  No retractions. Lungs CTAB. Gastrointestinal: No distention.  Genitourinary: No CVA tenderness. Musculoskeletal: No lower extremity edema.  Extremities warm and well perfused.  No midline spinal tenderness.  Mild bilateral lumbar paraspinal tenderness. Neurologic:  Normal speech and language. No gross focal neurologic deficits are appreciated.  Skin:  Skin is warm and dry. No rash noted. Psychiatric: Mood and affect are normal. Speech and behavior are normal.  ____________________________________________   LABS (all labs ordered are listed, but only abnormal results are displayed)  Labs Reviewed  BASIC METABOLIC PANEL - Abnormal; Notable for the following components:      Result Value   Glucose, Bld 102 (*)    All other components within normal limits  URINALYSIS, COMPLETE (UACMP) WITH MICROSCOPIC - Abnormal; Notable for the following components:   Color, Urine YELLOW (*)    APPearance HAZY (*)    Ketones, ur 20 (*)    Protein, ur 30 (*)    All other components within normal limits  CBC  POC URINE PREG, ED  POCT PREGNANCY, URINE  TROPONIN I (HIGH SENSITIVITY)   ____________________________________________  EKG  ED ECG REPORT I, Arta Silence, the attending physician, personally viewed and interpreted this ECG.  Date: 03/03/2019 EKG Time: 1118 Rate: 129 Rhythm: Sinus  tachycardia QRS Axis: normal Intervals: normal ST/T Wave abnormalities: Nonspecific ST abnormality, likely rate related Narrative Interpretation: no evidence of acute ischemia  ____________________________________________  RADIOLOGY  CXR: Left lower lobe infiltrate  ____________________________________________   PROCEDURES  Procedure(s) performed: No  Procedures  Critical Care performed: No ____________________________________________   INITIAL IMPRESSION / ASSESSMENT AND PLAN / ED COURSE  Pertinent labs & imaging results that were available during my care of the patient were reviewed by me and considered in my medical decision making (see chart for details).  A 33-year-old female with PMH as noted above presents with primary complaints of bilateral headache and lower back pain, but also reports nasal congestion, productive cough, and left-sided chest pain.  She tested negative for COVID-19 and flu 2 days ago.  On exam the patient is overall well-appearing.  She has a low-grade temperature and is tachycardic with otherwise normal vital signs.  She has no increased work  of breathing or respiratory distress.  Neurologic exam is nonfocal.  The remainder of the exam is unremarkable.  Lab work-up obtained from triage is within normal limits including a negative troponin.  Chest x-ray shows a faint infiltrate in the left lower lobe.  Given the patient's low-grade fever, cough, and other symptoms, the overall presentation is most consistent with community-acquired pneumonia.  Differential still includes COVID-19, however the patient is oxygenating normally and has no criteria for admission.  The headache and back pain are consistent with myalgias and symptoms related to fever.  There is no clinical evidence for PE as the patient has no hypoxia and the pain is in the low back.  Her EKG is nonischemic.  We will give fluids, Toradol, and plan for discharge with azithromycin for empiric  coverage for CAP.  ----------------------------------------- 4:00 PM on 03/03/2019 -----------------------------------------  The patient is feeling better after fluids and Toradol.  The tachycardia has resolved.  She continues to appear comfortable.  Urinalysis shows some RBCs but is otherwise negative.  She is stable for discharge at this time.  I counseled her on the results of the work-up and the plan of care.  I have prescribed azithromycin and will give a dose here prior to discharge.  Return precautions given, and she expresses understanding.  __________________________  Krystal Richard was evaluated in Emergency Department on 03/03/2019 for the symptoms described in the history of present illness. She was evaluated in the context of the global COVID-19 pandemic, which necessitated consideration that the patient might be at risk for infection with the SARS-CoV-2 virus that causes COVID-19. Institutional protocols and algorithms that pertain to the evaluation of patients at risk for COVID-19 are in a state of rapid change based on information released by regulatory bodies including the CDC and federal and state organizations. These policies and algorithms were followed during the patient's care in the ED.  ____________________________________________   FINAL CLINICAL IMPRESSION(S) / ED DIAGNOSES  Final diagnoses:  Community acquired pneumonia of left lower lobe of lung  Acute bilateral low back pain without sciatica      NEW MEDICATIONS STARTED DURING THIS VISIT:  New Prescriptions   AZITHROMYCIN (ZITHROMAX Z-PAK) 250 MG TABLET    Take 2 tablets (500 mg) on  Day 1,  followed by 1 tablet (250 mg) once daily on Days 2 through 5.   IBUPROFEN (ADVIL) 600 MG TABLET    Take 1 tablet (600 mg total) by mouth every 6 (six) hours as needed for fever or moderate pain.     Note:  This document was prepared using Dragon voice recognition software and may include unintentional dictation  errors.    Dionne BucySiadecki, Alethia Melendrez, MD 03/03/19 [phone]

## 2019-03-03 NOTE — ED Notes (Signed)
Says she

## 2019-03-03 NOTE — ED Notes (Signed)
Says headache and low back pain right now.  Says this started 4 days ago.  No sick contacts.  Headache is around eyes.  Says no nasal congestion, but has ahad a cough.

## 2019-07-18 ENCOUNTER — Emergency Department
Admission: EM | Admit: 2019-07-18 | Discharge: 2019-07-18 | Disposition: A | Discharge status: Home / Self Care | Payer: Medicaid Other | Attending: Emergency Medicine | Admitting: Emergency Medicine

## 2019-07-18 ENCOUNTER — Emergency Department: Payer: Medicaid Other

## 2019-07-18 ENCOUNTER — Other Ambulatory Visit: Payer: Self-pay

## 2019-07-18 ENCOUNTER — Encounter: Payer: Self-pay | Admitting: Emergency Medicine

## 2019-07-18 DIAGNOSIS — O4690 Antepartum hemorrhage, unspecified, unspecified trimester: Secondary | ICD-10-CM

## 2019-07-18 DIAGNOSIS — Z3A00 Weeks of gestation of pregnancy not specified: Secondary | ICD-10-CM | POA: Insufficient documentation

## 2019-07-18 DIAGNOSIS — N939 Abnormal uterine and vaginal bleeding, unspecified: Secondary | ICD-10-CM

## 2019-07-18 DIAGNOSIS — Z79899 Other long term (current) drug therapy: Secondary | ICD-10-CM | POA: Insufficient documentation

## 2019-07-18 DIAGNOSIS — O209 Hemorrhage in early pregnancy, unspecified: Secondary | ICD-10-CM | POA: Insufficient documentation

## 2019-07-18 LAB — ABO/RH: ABO/RH(D): O POS

## 2019-07-18 LAB — HCG, QUANTITATIVE, PREGNANCY: hCG, Beta Chain, Quant, S: 3454 m[IU]/mL — ABNORMAL HIGH (ref <5)

## 2019-07-18 LAB — POCT PREGNANCY, URINE: Preg Test, Ur: POSITIVE — AB

## 2019-07-18 NOTE — ED Triage Notes (Signed)
[redacted] weeks pregnant with vaginal bleeding.  Ambulatory, NAD. G4P3.  Pt has seen OB for pregnancy confirmation appointment and has [place] scheduled but has not had it yet.

## 2019-07-18 NOTE — ED Provider Notes (Signed)
[facility] Emergency Department Provider Note   ____________________________________________   I have reviewed the triage vital signs and the nursing notes.   HISTORY  Chief Complaint Vaginal Bleeding   History limited by: Not Limited   HPI Krystal Richard is a 29 y.o. female who presents to the emergency department today because of concerns for vaginal bleeding in the setting of early pregnancy.  Patient states that she had a recent positive pregnancy test and was stated to be roughly 7-1/[redacted] weeks along by dates through [facility].  Last night the patient started having some vaginal bleeding.  She states that it did get heavy and then started easing off.  This morning however the bleeding started again.  She has had some abdominal discomfort with it.  Patient has not had any similar issues with previous pregnancies.   Records reviewed. Per medical record review patient has a history of normal pregnancies.  Past Medical History:  Diagnosis Date  . Breast mass   . Sciatic pain   . SOB (shortness of breath) on exertion     Patient Active Problem List   Diagnosis Date Noted  . Indication for care in labor or delivery 03/08/2016  . Pregnant and not yet delivered 12/31/2015  . Syncope 12/23/2015  . Anemia in pregnancy 12/23/2015  . First trimester screening 09/07/2015  . Flu vaccine need 12/07/2014  . Leg swelling in pregnancy 11/22/2014  . Pregnancy 11/21/2014  . Supervision of normal pregnancy 09/13/2014  . Sciatic pain 09/12/2014    Past Surgical History:  Procedure Laterality Date  . EYE SURGERY      Prior to Admission medications   Medication Sig Start Date End Date Taking? Authorizing Provider  benzonatate (TESSALON PERLES) 100 MG capsule Take 1 capsule (100 mg total) by mouth 3 (three) times daily as needed for cough. 06/10/18   Darci Current, MD  dicyclomine (BENTYL) 10 MG capsule Take 1 capsule (10 mg total) by mouth 4 (four) times daily.  08/13/16 08/27/16  Nita Sickle, MD  ibuprofen (ADVIL) 600 MG tablet Take 1 tablet (600 mg total) by mouth every 6 (six) hours as needed for fever or moderate pain. 03/03/19   Dionne Bucy, MD  ondansetron (ZOFRAN ODT) 4 MG disintegrating tablet Take 1 tablet (4 mg total) by mouth every 8 (eight) hours as needed for nausea or vomiting. 08/13/16   Nita Sickle, MD  Prenatal Vit-Fe Fumarate-FA (MULTIVITAMIN-PRENATAL) 27-0.8 MG TABS tablet Take 1 tablet by mouth daily at 12 noon.    [provider]    Allergies Patient has no known allergies.  Family History  Problem Relation Age of Onset  . Hypertension Mother     Social History Social History   Tobacco Use  . Smoking status: Never Smoker  . Smokeless tobacco: Never Used  Substance Use Topics  . Alcohol use: No  . Drug use: No    Review of Systems Constitutional: No fever/chills Eyes: No visual changes. ENT: No sore throat. Cardiovascular: Denies chest pain. Respiratory: Denies shortness of breath. Gastrointestinal: Positive for lower abdominal cramping.  Genitourinary: Positive for vaginal bleeding.  Musculoskeletal: Negative for back pain. Skin: Negative for rash. Neurological: Negative for headaches, focal weakness or numbness.  ____________________________________________   PHYSICAL EXAM:  VITAL SIGNS: ED Triage Vitals  Enc Vitals Group     BP 07/18/19 0818 114/78     Pulse Rate 07/18/19 0818 90     Resp 07/18/19 0818 16     Temp 07/18/19 0818  98.2 F (36.8 C)     Temp Source 07/18/19 0818 Oral     SpO2 07/18/19 0818 100 %     Weight 07/18/19 0814 148 lb (67.1 kg)     Height 07/18/19 0814 5\' 6"  (1.676 m)     Head Circumference --      Peak Flow --      Pain Score 07/18/19 0814 5   Constitutional: Alert and oriented.  Eyes: Conjunctivae are normal.  ENT      Head: Normocephalic and atraumatic.      Nose: No congestion/rhinnorhea.      Mouth/Throat: Mucous membranes are moist.       Neck: No stridor. Hematological/Lymphatic/Immunilogical: No cervical lymphadenopathy. Cardiovascular: Normal rate, regular rhythm.  No murmurs, rubs, or gallops.  Respiratory: Normal respiratory effort without tachypnea nor retractions. Breath sounds are clear and equal bilaterally. No wheezes/rales/rhonchi. Gastrointestinal: Soft and non tender. No rebound. No guarding.  Genitourinary: Deferred Musculoskeletal: Normal range of motion in all extremities. No lower extremity edema. Neurologic:  Normal speech and language. No gross focal neurologic deficits are appreciated.  Skin:  Skin is warm, dry and intact. No rash noted. Psychiatric: Mood and affect are normal. Speech and behavior are normal. Patient exhibits appropriate insight and judgment.  ____________________________________________    LABS (pertinent positives/negatives)  O POS HCG 3454 Upreg positive  ____________________________________________   EKG  None  ____________________________________________    RADIOLOGY  US ob less than 14 weeks Pregnancy not definitively identified. Intrauterine sac like structure.  ____________________________________________   PROCEDURES  Procedures  ____________________________________________   INITIAL IMPRESSION / ASSESSMENT AND PLAN / ED COURSE  Pertinent labs & imaging results that were available during my care of the patient were reviewed by me and considered in my medical decision making (see chart for details).   Patient presented to the emergency department today because of concerns for vaginal bleeding in the setting of early pregnancy.  Beta-hCG and ultrasound are somewhat concerning for miscarriage given the patient's reported length of pregnancy.  I discussed this with the patient.  Also discussed possibility of simply an early pregnancy.  She has an appointment already scheduled Wednesday with her OB/GYN.  Furthermore discussed with patient potential for ectopic  although that I think this is much less likely given lack of significant abdominal pain.  ____________________________________________   FINAL CLINICAL IMPRESSION(S) / ED DIAGNOSES  Final diagnoses:  Vaginal bleeding  Vaginal bleeding in pregnancy     Note: This dictation was prepared with Dragon dictation. Any transcriptional errors that result from this process are unintentional     Nance Pear, MD 07/18/19 1126

## 2019-07-18 NOTE — Discharge Instructions (Addendum)
As we discussed it is important to continue to take prenatal vitamins until you can follow up with Dr. Valentino Saxon. Please seek medical attention for any high fevers, chest pain, shortness of breath, change in behavior, persistent vomiting, bloody stool or any other new or concerning symptoms.

## 2019-10-08 ENCOUNTER — Emergency Department
Admission: EM | Admit: 2019-10-08 | Discharge: 2019-10-08 | Discharge status: Against Medical Advice | Payer: Medicaid Other

## 2019-10-08 ENCOUNTER — Other Ambulatory Visit: Payer: Self-pay

## 2019-10-08 NOTE — ED Notes (Signed)
Pt checked in and reported she left something in the car and would be right back. Pt called x's 3 for triage, no response

## 2020-03-30 ENCOUNTER — Emergency Department
Admission: EM | Admit: 2020-03-30 | Discharge: 2020-03-30 | Discharge status: Against Medical Advice | Payer: Medicaid Other

## 2020-05-28 ENCOUNTER — Other Ambulatory Visit: Payer: Self-pay

## 2020-05-28 ENCOUNTER — Emergency Department
Admission: EM | Admit: 2020-05-28 | Discharge: 2020-05-28 | Disposition: A | Discharge status: Home / Self Care | Payer: Medicaid Other | Attending: Emergency Medicine | Admitting: Emergency Medicine

## 2020-05-28 DIAGNOSIS — N390 Urinary tract infection, site not specified: Secondary | ICD-10-CM | POA: Insufficient documentation

## 2020-05-28 LAB — BASIC METABOLIC PANEL
Anion gap: 8 (ref 5–15)
BUN: 13 mg/dL (ref 6–20)
CO2: 25 mmol/L (ref 22–32)
Calcium: 9.1 mg/dL (ref 8.9–10.3)
Chloride: 105 mmol/L (ref 98–111)
Creatinine, Ser: 0.58 mg/dL (ref 0.44–1.00)
GFR, Estimated: >60 mL/min (ref >60)
Glucose, Bld: 89 mg/dL (ref 70–99)
Potassium: 3.8 mmol/L (ref 3.5–5.1)
Sodium: 138 mmol/L (ref 135–145)

## 2020-05-28 LAB — URINALYSIS, COMPLETE (UACMP) WITH MICROSCOPIC
Bilirubin Urine: NEGATIVE
Glucose, UA: NEGATIVE mg/dL
Ketones, ur: NEGATIVE mg/dL
Nitrite: NEGATIVE
Protein, ur: 100 mg/dL — AB
RBC / HPF: >50 RBC/hpf — ABNORMAL HIGH (ref 0–5)
Specific Gravity, Urine: 1.024 (ref 1.005–1.030)
WBC, UA: >50 WBC/hpf — ABNORMAL HIGH (ref 0–5)
pH: 6 (ref 5.0–8.0)

## 2020-05-28 LAB — CBC
HCT: 37.6 % (ref 36.0–46.0)
Hemoglobin: 11.9 g/dL — ABNORMAL LOW (ref 12.0–15.0)
MCH: 27.2 pg (ref 26.0–34.0)
MCHC: 31.6 g/dL (ref 30.0–36.0)
MCV: 85.8 fL (ref 80.0–100.0)
Platelets: 303 10*3/uL (ref 150–400)
RBC: 4.38 MIL/uL (ref 3.87–5.11)
RDW: 12.5 % (ref 11.5–15.5)
WBC: 8.6 10*3/uL (ref 4.0–10.5)
nRBC: 0 % (ref 0.0–0.2)

## 2020-05-28 LAB — POC URINE PREG, ED: Preg Test, Ur: NEGATIVE

## 2020-05-28 MED ORDER — AMOXICILLIN 250 MG/5ML PO SUSR
500.0000 mg | Freq: Once | ORAL | Status: AC
  Administered 2020-05-28: 500 mg via ORAL
  Filled 2020-05-28 (×2): qty 10

## 2020-05-28 MED ORDER — AMOXICILLIN-POT CLAVULANATE 250-62.5 MG/5ML PO SUSR: 500.0000 mg | Freq: Two times a day (BID) | ORAL | 0 refills | Status: AC

## 2020-05-28 MED ORDER — CEPHALEXIN 500 MG PO CAPS: 500.0000 mg | ORAL_CAPSULE | Freq: Two times a day (BID) | ORAL | 0 refills | Status: DC

## 2020-05-28 MED ORDER — CEPHALEXIN 500 MG PO CAPS
500.0000 mg | ORAL_CAPSULE | Freq: Once | ORAL | Status: AC
  Administered 2020-05-28: 500 mg via ORAL
  Filled 2020-05-28: qty 1

## 2020-05-28 NOTE — ED Triage Notes (Signed)
Pt to ED POV for right flank and abdominal pain that started 2 days ago with burning with urination

## 2020-05-28 NOTE — ED Notes (Signed)
Called pharmacy for med

## 2020-05-28 NOTE — ED Provider Notes (Addendum)
[facility] Emergency Department Provider Note ____________________________________________   Event Date/Time   First MD Initiated Contact with Patient 05/28/20 1352     (approximate)  I have reviewed the triage vital signs and the nursing notes.   HISTORY  Chief Complaint Flank Pain    HPI Krystal Richard is a 30 y.o. female no significant past medical history other than recurrent urinary tract infections, previous pregnancies, use of NuvaRing  All patient reports that since last evening she been having discomfort in her lower abdomen and pain with urination.  No vaginal bleeding.  Last menstrual cycle started on the 16th was normal.  She has noticed though a small amounts of what seems like flecks of blood in her urine.  Pain is mostly when she urinates located in her lower abdomen.  No chest pain or cough no trouble breathing no headaches.  Ports frequent urinary tract infections last one she thinks was several months ago no pain in the mid back or kidney area  Past Medical History:  Diagnosis Date  . Breast mass   . Sciatic pain   . SOB (shortness of breath) on exertion     Patient Active Problem List   Diagnosis Date Noted  . Indication for care in labor or delivery 03/08/2016  . Pregnant and not yet delivered 12/31/2015  . Syncope 12/23/2015  . Anemia in pregnancy 12/23/2015  . First trimester screening 09/07/2015  . Flu vaccine need 12/07/2014  . Leg swelling in pregnancy 11/22/2014  . Pregnancy 11/21/2014  . Supervision of normal pregnancy 09/13/2014  . Sciatic pain 09/12/2014    Past Surgical History:  Procedure Laterality Date  . EYE SURGERY      Prior to Admission medications   Medication Sig Start Date End Date Taking? Authorizing Provider  cephALEXin (KEFLEX) 500 MG capsule Take 1 capsule (500 mg total) by mouth 2 (two) times daily for 5 days. 05/28/20 06/02/20 Yes Sharyn Creamer, MD  benzonatate (TESSALON PERLES) 100 MG  capsule Take 1 capsule (100 mg total) by mouth 3 (three) times daily as needed for cough. 06/10/18   Darci Current, MD  dicyclomine (BENTYL) 10 MG capsule Take 1 capsule (10 mg total) by mouth 4 (four) times daily. 08/13/16 08/27/16  Nita Sickle, MD  ibuprofen (ADVIL) 600 MG tablet Take 1 tablet (600 mg total) by mouth every 6 (six) hours as needed for fever or moderate pain. 03/03/19   Dionne Bucy, MD  ondansetron (ZOFRAN ODT) 4 MG disintegrating tablet Take 1 tablet (4 mg total) by mouth every 8 (eight) hours as needed for nausea or vomiting. 08/13/16   Nita Sickle, MD  Prenatal Vit-Fe Fumarate-FA (MULTIVITAMIN-PRENATAL) 27-0.8 MG TABS tablet Take 1 tablet by mouth daily at 12 noon.    [provider]    Allergies Patient has no known allergies.  Family History  Problem Relation Age of Onset  . Hypertension Mother     Social History Social History   Tobacco Use  . Smoking status: Never Smoker  . Smokeless tobacco: Never Used  Substance Use Topics  . Alcohol use: No  . Drug use: No    Review of Systems Constitutional: No fever/chills Cardiovascular: Denies chest pain. Respiratory: Denies shortness of breath. Gastrointestinal: No abdominal pain except as noted in HPI.   Genitourinary: Painful urination.  Denies pregnancy.  No vaginal discharge or bleeding. Musculoskeletal: Negative for back pain except for an aching in her lower pelvis mostly with urination. Skin: Negative for rash.  Neurological: Negative for headaches, areas of focal weakness or numbness.    ____________________________________________   PHYSICAL EXAM:  VITAL SIGNS: ED Triage Vitals  Enc Vitals Group     BP 05/28/20 1334 100/71     Pulse Rate 05/28/20 1334 95     Resp 05/28/20 1334 16     Temp 05/28/20 1334 98.2 F (36.8 C)     Temp Source 05/28/20 1334 Oral     SpO2 05/28/20 1334 100 %     Weight 05/28/20 1334 137 lb (62.1 kg)     Height 05/28/20 1334 5\' 6"  (1.676  m)     Head Circumference --      Peak Flow --      Pain Score 05/28/20 1334 7     Pain Loc --      Pain Edu? --      Excl. in GC? --     Constitutional: Alert and oriented. Well appearing and in no acute distress. Eyes: Conjunctivae are normal. Head: Atraumatic. Nose: No congestion/rhinnorhea. Mouth/Throat: Mucous membranes are moist. Neck: No stridor.  Cardiovascular: Normal rate, regular rhythm. Grossly normal heart sounds.  Good peripheral circulation. Respiratory: Normal respiratory effort.  No retractions. Lungs CTAB. Gastrointestinal: Soft and nontender except in the suprapubic region she reports discomfort to palpation.  No pain McBurney's point.  Negative Murphy.. No distention. Musculoskeletal: No lower extremity tenderness nor edema.  No CVA tenderness to percussion bilateral. Neurologic:  Normal speech and language. No gross focal neurologic deficits are appreciated.  Skin:  Skin is warm, dry and intact. No rash noted. Psychiatric: Mood and affect are normal. Speech and behavior are normal.  ____________________________________________   LABS (all labs ordered are listed, but only abnormal results are displayed)  Labs Reviewed  URINALYSIS, COMPLETE (UACMP) WITH MICROSCOPIC - Abnormal; Notable for the following components:      Result Value   Color, Urine YELLOW (*)    APPearance CLOUDY (*)    Hgb urine dipstick LARGE (*)    Protein, ur 100 (*)    Leukocytes,Ua SMALL (*)    RBC / HPF >50 (*)    WBC, UA >50 (*)    Bacteria, UA RARE (*)    All other components within normal limits  CBC - Abnormal; Notable for the following components:   Hemoglobin 11.9 (*)    All other components within normal limits  URINE CULTURE  BASIC METABOLIC PANEL  POC URINE PREG, ED   ____________________________________________  EKG   ____________________________________________  RADIOLOGY   ____________________________________________   PROCEDURES  Procedure(s)  performed: None  Procedures  Critical Care performed: No  ____________________________________________   INITIAL IMPRESSION / ASSESSMENT AND PLAN / ED COURSE  Pertinent labs & imaging results that were available during my care of the patient were reviewed by me and considered in my medical decision making (see chart for details).   Patient presents for dysuria and lower abdominal pain.  History of urinary tract infections, based on the clinical history strongly suggestive of same.  No evidence of sepsis.  CBC normal except for some very mild anemia.  BMP normal.  No evidence sepsis, urinalysis indicative of urinary tract infection.  Appears uncomplicated at this time.  Reassuring abdominal exam.  Discussed with patient careful return precautions treatment recommendations and will try cephalexin  Return precautions and treatment recommendations and follow-up discussed with the patient who is agreeable with the plan.       ____________________________________________   FINAL CLINICAL IMPRESSION(S) / ED DIAGNOSES  Final diagnoses:  Lower urinary tract infection, acute        Note:  This document was prepared using Dragon voice recognition software and may include unintentional dictation errors       Sharyn Creamer, MD 05/28/20 1534    Prescription switched to amoxicillin liquid , patient advised she cannot swallow pills.    Sharyn Creamer, MD 05/28/20 1538

## 2020-05-30 LAB — URINE CULTURE: Culture: >=100000 — AB

## 2020-07-24 ENCOUNTER — Other Ambulatory Visit: Payer: Self-pay

## 2020-07-24 ENCOUNTER — Emergency Department: Payer: Self-pay

## 2020-07-24 ENCOUNTER — Encounter: Payer: Self-pay | Admitting: Emergency Medicine

## 2020-07-24 DIAGNOSIS — J019 Acute sinusitis, unspecified: Secondary | ICD-10-CM | POA: Insufficient documentation

## 2020-07-24 DIAGNOSIS — J209 Acute bronchitis, unspecified: Secondary | ICD-10-CM | POA: Insufficient documentation

## 2020-07-24 NOTE — ED Triage Notes (Signed)
Patient ambulatory to triage with steady gait, without difficulty or distress noted; pt reports fever today with sinus pressure, prod cough green sputum with runny nose; COVID & flu neg yesterday

## 2020-07-25 ENCOUNTER — Other Ambulatory Visit: Payer: Self-pay

## 2020-07-25 ENCOUNTER — Emergency Department
Admission: EM | Admit: 2020-07-25 | Discharge: 2020-07-25 | Discharge status: Against Medical Advice | Payer: Medicaid Other | Attending: Emergency Medicine | Admitting: Emergency Medicine

## 2020-07-25 ENCOUNTER — Emergency Department
Admission: EM | Admit: 2020-07-25 | Discharge: 2020-07-25 | Disposition: A | Discharge status: Home / Self Care | Payer: Self-pay | Attending: Emergency Medicine | Admitting: Emergency Medicine

## 2020-07-25 ENCOUNTER — Encounter: Payer: Self-pay | Admitting: Emergency Medicine

## 2020-07-25 DIAGNOSIS — J209 Acute bronchitis, unspecified: Secondary | ICD-10-CM

## 2020-07-25 DIAGNOSIS — J019 Acute sinusitis, unspecified: Secondary | ICD-10-CM

## 2020-07-25 DIAGNOSIS — R21 Rash and other nonspecific skin eruption: Secondary | ICD-10-CM | POA: Insufficient documentation

## 2020-07-25 DIAGNOSIS — Z5321 Procedure and treatment not carried out due to patient leaving prior to being seen by health care provider: Secondary | ICD-10-CM | POA: Insufficient documentation

## 2020-07-25 MED ORDER — ALBUTEROL SULFATE HFA 108 (90 BASE) MCG/ACT IN AERS: 2.0000 | INHALATION_SPRAY | RESPIRATORY_TRACT | 0 refills | Status: DC | PRN

## 2020-07-25 MED ORDER — IPRATROPIUM-ALBUTEROL 0.5-2.5 (3) MG/3ML IN SOLN
3.0000 mL | Freq: Once | RESPIRATORY_TRACT | Status: AC
  Administered 2020-07-25: 3 mL via RESPIRATORY_TRACT
  Filled 2020-07-25: qty 3

## 2020-07-25 MED ORDER — PREDNISONE 20 MG PO TABS
40.0000 mg | ORAL_TABLET | ORAL | Status: AC
  Administered 2020-07-25: 40 mg via ORAL
  Filled 2020-07-25: qty 2

## 2020-07-25 MED ORDER — PREDNISONE 20 MG PO TABS: 40.0000 mg | ORAL_TABLET | Freq: Every day | ORAL | 0 refills | Status: AC

## 2020-07-25 MED ORDER — DOXYCYCLINE HYCLATE 100 MG PO CAPS: 100.0000 mg | ORAL_CAPSULE | Freq: Two times a day (BID) | ORAL | 0 refills | Status: AC

## 2020-07-25 MED ORDER — ONDANSETRON 4 MG PO TBDP: 4.0000 mg | ORAL_TABLET | Freq: Three times a day (TID) | ORAL | 0 refills | Status: DC | PRN

## 2020-07-25 NOTE — ED Notes (Signed)
Pt reports cough and congestion over the past few weeks and hasn't gotten any better. Pt reports L sided facial pain d/t congestion. States she has been tested for COVID/flu several times in the past few weeks and all tests were negative. AAO NAD VSS

## 2020-07-25 NOTE — ED Triage Notes (Signed)
Pt to ED from home c/o rash to bilateral arms.  States seen yesterday for pneumonia and bronchitis and treated for with ABX, prednisone, and nebulizer.  Went home and started noticing rash appear today around 0900, told over phone to take benadryl with some relief today.  Denies tongue, lip or throat involvement.  Chest rise even and unlabored.  Areas red, non raised, itching.

## 2020-07-25 NOTE — ED Provider Notes (Signed)
[facility] Emergency Department Provider Note  ____________________________________________  Time seen: Approximately 3:03 AM  I have reviewed the triage vital signs and the nursing notes.   HISTORY  Chief Complaint Cough    HPI Krystal Richard is a 30 y.o. female with no significant past medical history who comes ED complaining of productive cough and sinus congestion for the past 4 weeks.  Associated with some mild shortness of breath.  No chest pain fevers or chills.  Recently had COVID and flu PCR test which were negative.  Reports decreased oral intake.      Past Medical History:  Diagnosis Date  . Breast mass   . Sciatic pain   . SOB (shortness of breath) on exertion      Patient Active Problem List   Diagnosis Date Noted  . Indication for care in labor or delivery 03/08/2016  . Pregnant and not yet delivered 12/31/2015  . Syncope 12/23/2015  . Anemia in pregnancy 12/23/2015  . First trimester screening 09/07/2015  . Flu vaccine need 12/07/2014  . Leg swelling in pregnancy 11/22/2014  . Pregnancy 11/21/2014  . Supervision of normal pregnancy 09/13/2014  . Sciatic pain 09/12/2014     Past Surgical History:  Procedure Laterality Date  . EYE SURGERY       Prior to Admission medications   Medication Sig Start Date End Date Taking? Authorizing Provider  albuterol (PROVENTIL HFA) 108 (90 Base) MCG/ACT inhaler Inhale 2 puffs into the lungs every 4 (four) hours as needed for wheezing or shortness of breath. 07/25/20  Yes Sharman Cheek, MD  doxycycline (VIBRAMYCIN) 100 MG capsule Take 1 capsule (100 mg total) by mouth 2 (two) times daily for 10 days. 07/25/20 08/04/20 Yes Sharman Cheek, MD  ondansetron (ZOFRAN ODT) 4 MG disintegrating tablet Take 1 tablet (4 mg total) by mouth every 8 (eight) hours as needed for nausea or vomiting. 07/25/20  Yes Sharman Cheek, MD  predniSONE (DELTASONE) 20 MG tablet Take 2 tablets (40 mg total) by  mouth daily with breakfast for 4 days. 07/25/20 07/29/20 Yes Sharman Cheek, MD  benzonatate (TESSALON PERLES) 100 MG capsule Take 1 capsule (100 mg total) by mouth 3 (three) times daily as needed for cough. 06/10/18   Darci Current, MD  dicyclomine (BENTYL) 10 MG capsule Take 1 capsule (10 mg total) by mouth 4 (four) times daily. 08/13/16 08/27/16  Nita Sickle, MD  ibuprofen (ADVIL) 600 MG tablet Take 1 tablet (600 mg total) by mouth every 6 (six) hours as needed for fever or moderate pain. 03/03/19   Dionne Bucy, MD  Prenatal Vit-Fe Fumarate-FA (MULTIVITAMIN-PRENATAL) 27-0.8 MG TABS tablet Take 1 tablet by mouth daily at 12 noon.    [provider]     Allergies Patient has no known allergies.   Family History  Problem Relation Age of Onset  . Hypertension Mother     Social History Social History   Tobacco Use  . Smoking status: Never Smoker  . Smokeless tobacco: Never Used  Vaping Use  . Vaping Use: Never used  Substance Use Topics  . Alcohol use: No  . Drug use: No    Review of Systems  Constitutional:   No fever or chills.  ENT:   No sore throat.  Positive rhinorrhea and sinus congestion Cardiovascular:   No chest pain or syncope. Respiratory:   Positive shortness of breath and cough. Gastrointestinal:   Negative for abdominal pain, vomiting and diarrhea.  Musculoskeletal:   Negative for  focal pain or swelling All other systems reviewed and are negative except as documented above in ROS and HPI.  ____________________________________________   PHYSICAL EXAM:  VITAL SIGNS: ED Triage Vitals  Enc Vitals Group     BP 07/24/20 2258 106/75     Pulse Rate 07/24/20 2258 96     Resp 07/24/20 2258 20     Temp 07/24/20 2258 98.2 F (36.8 C)     Temp Source 07/24/20 2258 Oral     SpO2 07/24/20 2258 97 %     Weight 07/24/20 2300 150 lb (68 kg)     Height 07/24/20 2300 5\' 6"  (1.676 m)     Head Circumference --      Peak Flow --      Pain Score  07/24/20 2303 9     Pain Loc --      Pain Edu? --      Excl. in GC? --     Vital signs reviewed, nursing assessments reviewed.   Constitutional:   Alert and oriented. Non-toxic appearance. Eyes:   Conjunctivae are normal. EOMI. PERRL. ENT      Head:   Normocephalic and atraumatic.      Nose:   Congestion      Mouth/Throat:   Mild pharyngeal erythema.      Neck:   No meningismus. Full ROM. Hematological/Lymphatic/Immunilogical:   No cervical lymphadenopathy. Cardiovascular:   RRR. Symmetric bilateral radial and DP pulses.  No murmurs. Cap refill less than 2 seconds. Respiratory:   Normal respiratory effort without tachypnea/retractions. Breath sounds are clear and equal bilaterally. No wheezes/rales/rhonchi.  Inducible wheezing with cough Gastrointestinal:   Soft and nontender. Non distended. There is no CVA tenderness.  No rebound, rigidity, or guarding. Genitourinary:   deferred Musculoskeletal:   Normal range of motion in all extremities. No joint effusions.  No lower extremity tenderness.  No edema. Neurologic:   Normal speech and language.  Motor grossly intact. No acute focal neurologic deficits are appreciated.  Skin:    Skin is warm, dry and intact. No rash noted.  No petechiae, purpura, or bullae.  ____________________________________________    LABS (pertinent positives/negatives) (all labs ordered are listed, but only abnormal results are displayed) Labs Reviewed - No data to display ____________________________________________   EKG    ____________________________________________    RADIOLOGY  DG Chest 2 View  Result Date: 07/24/2020 CLINICAL DATA:  30 year old female with cough. EXAM: CHEST - 2 VIEW COMPARISON:  Chest radiograph dated 03/03/2019. FINDINGS: The heart size and mediastinal contours are within normal limits. Both lungs are clear. The visualized skeletal structures are unremarkable. IMPRESSION: No active cardiopulmonary disease. Electronically  Signed   By: 14/05/2018 M.D.   On: 07/24/2020 23:59    ____________________________________________   PROCEDURES Procedures  ____________________________________________    CLINICAL IMPRESSION / ASSESSMENT AND PLAN / ED COURSE  Medications ordered in the ED: Medications  predniSONE (DELTASONE) tablet 40 mg (40 mg Oral Given 07/25/20 0215)  ipratropium-albuterol (DUONEB) 0.5-2.5 (3) MG/3ML nebulizer solution 3 mL (3 mLs Nebulization Given 07/25/20 0215)    Pertinent labs & imaging results that were available during my care of the patient were reviewed by me and considered in my medical decision making (see chart for details).  Krystal Richard was evaluated in Emergency Department on 07/25/2020 for the symptoms described in the history of present illness. She was evaluated in the context of the global COVID-19 pandemic, which necessitated consideration that the patient might be at risk for  infection with the SARS-CoV-2 virus that causes COVID-19. Institutional protocols and algorithms that pertain to the evaluation of patients at risk for COVID-19 are in a state of rapid change based on information released by regulatory bodies including the CDC and federal and state organizations. These policies and algorithms were followed during the patient's care in the ED.   Patient presents with symptoms and exam consistent with bronchitis and sinusitis.  With this illness ongoing over the course of several weeks, will provide prednisone, albuterol, doxycycline.  Chest x-ray is unremarkable, vital signs are normal.  She is stable for discharge and outpatient follow-up.   Considering the patient's symptoms, medical history, and physical examination today, I have low suspicion for ACS, PE, TAD, pneumothorax, carditis, mediastinitis, pneumonia, CHF, or sepsis.        ____________________________________________   FINAL CLINICAL IMPRESSION(S) / ED DIAGNOSES    Final diagnoses:  Acute  bronchitis, unspecified organism  Acute non-recurrent sinusitis, unspecified location     ED Discharge Orders         Ordered    predniSONE (DELTASONE) 20 MG tablet  Daily with breakfast        07/25/20 0301    albuterol (PROVENTIL HFA) 108 (90 Base) MCG/ACT inhaler  Every 4 hours PRN        07/25/20 0301    doxycycline (VIBRAMYCIN) 100 MG capsule  2 times daily        07/25/20 0301    ondansetron (ZOFRAN ODT) 4 MG disintegrating tablet  Every 8 hours PRN        07/25/20 0301          Portions of this note were generated with dragon dictation software. Dictation errors may occur despite best attempts at proofreading.   Sharman Cheek, MD 07/25/20 [phone]

## 2020-07-25 NOTE — ED Notes (Signed)
Pt eloped from room   md aware.

## 2021-04-23 ENCOUNTER — Emergency Department
Admission: EM | Admit: 2021-04-23 | Discharge: 2021-04-23 | Disposition: A | Discharge status: Home / Self Care | Payer: Medicaid Other | Attending: Emergency Medicine | Admitting: Emergency Medicine

## 2021-04-23 DIAGNOSIS — R079 Chest pain, unspecified: Secondary | ICD-10-CM | POA: Insufficient documentation

## 2021-04-23 DIAGNOSIS — R0602 Shortness of breath: Secondary | ICD-10-CM | POA: Insufficient documentation

## 2021-04-23 DIAGNOSIS — Z5321 Procedure and treatment not carried out due to patient leaving prior to being seen by health care provider: Secondary | ICD-10-CM | POA: Insufficient documentation

## 2021-04-23 NOTE — ED Triage Notes (Signed)
This RN took pt to triage 3 to obtain EKG d/t cc of chest pain and shob. While walking back, pt asked if could receive O2 d/t shob. RN explained to pt, would assess need for O2. Pt walked out of triage room, after stating, "I am not going to stay here for an all day thing if theres nothing yall can even do". Atttempted to explain to pt that I have not had a chance to assess her yet and shob can be treated based on assessment. Pt continued to walk out of triage, out of lobby. Steady gait, NAD noted

## 2021-06-04 ENCOUNTER — Other Ambulatory Visit: Payer: Self-pay | Admitting: Internal Medicine

## 2021-06-04 DIAGNOSIS — R2231 Localized swelling, mass and lump, right upper limb: Secondary | ICD-10-CM

## 2021-06-07 ENCOUNTER — Ambulatory Visit
Admission: RE | Admit: 2021-06-07 | Discharge: 2021-06-07 | Disposition: A | Discharge status: Home / Self Care | Payer: Managed Care, Other (non HMO) | Source: Ambulatory Visit | Attending: Internal Medicine | Admitting: Internal Medicine

## 2021-06-07 DIAGNOSIS — R2231 Localized swelling, mass and lump, right upper limb: Secondary | ICD-10-CM

## 2021-06-21 ENCOUNTER — Emergency Department: Payer: Managed Care, Other (non HMO)

## 2021-06-21 ENCOUNTER — Other Ambulatory Visit: Payer: Self-pay

## 2021-06-21 ENCOUNTER — Encounter: Payer: Self-pay | Admitting: *Deleted

## 2021-06-21 ENCOUNTER — Emergency Department
Admission: EM | Admit: 2021-06-21 | Discharge: 2021-06-22 | Disposition: A | Discharge status: Home / Self Care | Payer: Managed Care, Other (non HMO) | Attending: Emergency Medicine | Admitting: Emergency Medicine

## 2021-06-21 DIAGNOSIS — F419 Anxiety disorder, unspecified: Secondary | ICD-10-CM | POA: Diagnosis not present

## 2021-06-21 DIAGNOSIS — R0600 Dyspnea, unspecified: Secondary | ICD-10-CM

## 2021-06-21 DIAGNOSIS — R0602 Shortness of breath: Secondary | ICD-10-CM | POA: Diagnosis present

## 2021-06-21 NOTE — ED Triage Notes (Signed)
Pt reports sob for 15 minutes.  Hx anxiety.  Pt took meds to help sleep and became sob.  No chest pain.  Pt alert  speech clear ?

## 2021-06-21 NOTE — ED Notes (Signed)
Pt request labs not be obtained at this time.  ?

## 2021-06-22 NOTE — ED Provider Notes (Signed)
? ?[facility] ?Provider Note ? ? ? Event Date/Time  ? First MD Initiated Contact with Patient 06/21/21 2301   ?  (approximate) ? ? ?History  ? ?Shortness of Breath ? ? ?HPI ? ?Krystal Richard is a 31 y.o. female who reports a history of anxiety and presents for evaluation of a cute onset shortness of breath with a foreign body sensation or sensation of swelling in her throat.  She said that she is on escitalopram and she was recently prescribed trazodone to help her sleep.  She took the first dose tonight and said that immediately after taking it she felt like her throat was closing and it there was a lump in her throat.  She said that she started to panic and came immediately to the emergency department.  By the time she was here she was feeling better.  She does not think anything is stuck in her throat.  She is not having any trouble swallowing or breathing or speaking.  She declined blood work upon arrival.  She says that she thinks that she might of just had a "mini panic attack". ?  ? ? ?Physical Exam  ? ?Triage Vital Signs: ?ED Triage Vitals  ?Enc Vitals Group  ?   BP 06/21/21 2254 127/83  ?   Pulse Rate 06/21/21 2254 98  ?   Resp 06/21/21 2254 18  ?   Temp 06/21/21 2254 98.4 ?F (36.9 ?C)  ?   Temp Source 06/21/21 2254 Oral  ?   SpO2 06/21/21 2254 99 %  ?   Weight 06/21/21 2249 71.2 kg (157 lb)  ?   Height 06/21/21 2249 1.702 m (5\' 7" )  ?   Head Circumference --   ?   Peak Flow --   ?   Pain Score 06/21/21 2249 0  ?   Pain Loc --   ?   Pain Edu? --   ?   Excl. in GC? --   ? ? ?Most recent vital signs: ?Vitals:  ? 06/21/21 2254  ?BP: 127/83  ?Pulse: 98  ?Resp: 18  ?Temp: 98.4 ?F (36.9 ?C)  ?SpO2: 99%  ? ? ? ?General: Awake, no distress.  ?CV:  Good peripheral perfusion.  ?Resp:  Normal effort. Lungs are clear to auscultation. ?Abd:  No distention.  ?Other:  Normal voice, normal swallowing, no palpable lymphadenopathy, normal-appearing oropharynx, no tripoding, no drooling. ? ? ?ED  Results / Procedures / Treatments  ? ?Labs ?(all labs ordered are listed, but only abnormal results are displayed) ?Labs Reviewed - No data to display ? ? ? ?EKG ? ?ED ECG REPORT ?[id number], the attending physician, personally viewed and interpreted this ECG. ? ?Date: 06/21/2021 ?EKG Time: 22: 52 ?Rate: 99 ?Rhythm: normal sinus rhythm with sinus arrhythmia ?QRS Axis: normal ?Intervals: normal ?ST/T Wave abnormalities: normal ?Narrative Interpretation: no evidence of acute ischemia ? ? ? ?RADIOLOGY ?I personally reviewed the patient's chest x-ray and I see no evidence of any acute abnormality.  The radiology report agrees. ? ? ? ?PROCEDURES: ? ?Critical Care performed: No ? ?.1-3 Lead EKG Interpretation ?Performed by: 06/23/2021, MD ?Authorized by: Loleta Rose, MD  ? ?  Interpretation: normal   ?  ECG rate:  90 ?  ECG rate assessment: normal   ?  Rhythm: sinus rhythm   ?  Ectopy: none   ?  Conduction: normal   ? ? ?MEDICATIONS ORDERED IN ED: ?Medications - No data to display ? ? ?  IMPRESSION / MDM / ASSESSMENT AND PLAN / ED COURSE  ?I reviewed the triage vital signs and the nursing notes. ?             ?               ? ?Differential diagnosis includes, but is not limited to, panic attack, globus sensation, allergic reaction, esophageal foreign body. ? ?The patient was on the cardiac monitor to evaluate for evidence of arrhythmia and/or significant heart rate changes. ? ?Onset of symptoms was immediately after swallowing the pill and I strongly doubt this is an allergic reaction.  I believe that she had a panic attack and the patient believes this is well.  She is well-appearing and in no distress at this time.  She has no complaints, refused blood work, and wants to go home.  I personally reviewed her EKG which shows no sign of ischemia and her chest x-ray which shows no sign of acute abnormality.  She has a reassuring physical exam.  She was in a normal rate and rhythm on the cardiac monitor.  I offered  her additional period of observation but she would prefer to go home and I believe this is appropriate.  There is no indication for admission, nor additional treatment or observation at this time.  I gave my usual and customary return precautions.  Patient is in good spirits and agrees with the plan. ?  ? ? ?FINAL CLINICAL IMPRESSION(S) / ED DIAGNOSES  ? ?Final diagnoses:  ?Dyspnea, unspecified type  ?Anxiety  ? ? ? ?Rx / DC Orders  ? ?ED Discharge Orders   ? ? None  ? ?  ? ? ? ?Note:  This document was prepared using Dragon voice recognition software and may include unintentional dictation errors. ?  ?Loleta Rose, MD ?06/22/21 0012 ? ?

## 2021-06-22 NOTE — Discharge Instructions (Signed)
Your workup in the Emergency Department today was reassuring.  We did not find any specific abnormalities.  We recommend you drink plenty of fluids, take your regular medications and/or any new ones prescribed today, and follow up with the doctor(s) listed in these documents as recommended.  Return to the Emergency Department if you develop new or worsening symptoms that concern you.  

## 2022-09-02 ENCOUNTER — Other Ambulatory Visit: Payer: Self-pay

## 2022-09-02 ENCOUNTER — Encounter: Payer: Self-pay | Admitting: Intensive Care

## 2022-09-02 ENCOUNTER — Emergency Department
Admission: EM | Admit: 2022-09-02 | Discharge: 2022-09-02 | Disposition: A | Discharge status: Home / Self Care | Payer: Managed Care, Other (non HMO) | Attending: Emergency Medicine | Admitting: Emergency Medicine

## 2022-09-02 ENCOUNTER — Emergency Department: Payer: Managed Care, Other (non HMO)

## 2022-09-02 DIAGNOSIS — R519 Headache, unspecified: Secondary | ICD-10-CM | POA: Diagnosis present

## 2022-09-02 DIAGNOSIS — J0101 Acute recurrent maxillary sinusitis: Secondary | ICD-10-CM | POA: Insufficient documentation

## 2022-09-02 LAB — URINALYSIS, ROUTINE W REFLEX MICROSCOPIC
Bilirubin Urine: NEGATIVE
Glucose, UA: NEGATIVE mg/dL
Hgb urine dipstick: NEGATIVE
Ketones, ur: NEGATIVE mg/dL
Leukocytes,Ua: NEGATIVE
Nitrite: NEGATIVE
Protein, ur: NEGATIVE mg/dL
Specific Gravity, Urine: 1.016 (ref 1.005–1.030)
pH: 6 (ref 5.0–8.0)

## 2022-09-02 LAB — CBC WITH DIFFERENTIAL/PLATELET
Abs Immature Granulocytes: 0.01 10*3/uL (ref 0.00–0.07)
Basophils Absolute: 0 10*3/uL (ref 0.0–0.1)
Basophils Relative: 1 %
Eosinophils Absolute: 0.1 10*3/uL (ref 0.0–0.5)
Eosinophils Relative: 2 %
HCT: 40.5 % (ref 36.0–46.0)
Hemoglobin: 12.7 g/dL (ref 12.0–15.0)
Immature Granulocytes: 0 %
Lymphocytes Relative: 41 %
Lymphs Abs: 2.1 10*3/uL (ref 0.7–4.0)
MCH: 27.1 pg (ref 26.0–34.0)
MCHC: 31.4 g/dL (ref 30.0–36.0)
MCV: 86.4 fL (ref 80.0–100.0)
Monocytes Absolute: 0.5 10*3/uL (ref 0.1–1.0)
Monocytes Relative: 9 %
Neutro Abs: 2.4 10*3/uL (ref 1.7–7.7)
Neutrophils Relative %: 47 %
Platelets: 365 10*3/uL (ref 150–400)
RBC: 4.69 MIL/uL (ref 3.87–5.11)
RDW: 12.6 % (ref 11.5–15.5)
WBC: 5.1 10*3/uL (ref 4.0–10.5)
nRBC: 0 % (ref 0.0–0.2)

## 2022-09-02 LAB — BASIC METABOLIC PANEL
Anion gap: 7 (ref 5–15)
BUN: 11 mg/dL (ref 6–20)
CO2: 24 mmol/L (ref 22–32)
Calcium: 9 mg/dL (ref 8.9–10.3)
Chloride: 105 mmol/L (ref 98–111)
Creatinine, Ser: 0.62 mg/dL (ref 0.44–1.00)
GFR, Estimated: >60 mL/min (ref >60)
Glucose, Bld: 80 mg/dL (ref 70–99)
Potassium: 4.2 mmol/L (ref 3.5–5.1)
Sodium: 136 mmol/L (ref 135–145)

## 2022-09-02 LAB — POC URINE PREG, ED: Preg Test, Ur: NEGATIVE

## 2022-09-02 MED ORDER — LEVOFLOXACIN 750 MG PO TABS: 750.0000 mg | ORAL_TABLET | Freq: Every day | ORAL | 0 refills | Status: AC

## 2022-09-02 NOTE — ED Triage Notes (Signed)
Arrives from [facility] for evaluation of possible Meningitis.  Patient with neck pain and sinus drainage.  AAOx3.  Skin warm and dry. NAD

## 2022-09-02 NOTE — ED Provider Triage Note (Signed)
Emergency Medicine Provider Triage Evaluation Note  Krystal Richard , a 32 y.o. female  was evaluated in triage.  Pt complains of headache, clear drainage from nose and ear, intermittent for at least 1 month, no fever, has been treated for sinus infection without relief.  Review of Systems  Positive:  Negative:   Physical Exam  Temp 98.1 F (36.7 C) (Oral)  Gen:   Awake, no distress   Resp:  Normal effort  MSK:   Moves extremities without difficulty  Other:    Medical Decision Making  Medically screening exam initiated at 11:41 AM.  Appropriate orders placed.  Krystal Richard was informed that the remainder of the evaluation will be completed by another provider, this initial triage assessment does not replace that evaluation, and the importance of remaining in the ED until their evaluation is complete.     Faythe Ghee, PA-C 09/02/22 1142

## 2022-09-02 NOTE — ED Triage Notes (Addendum)
Patient c/o neck pain and drainage coming out of her nose and left ear since January 2024. Also reports right leg pain. Has been on antibiotics to rule out sinus infection.  C/o fever yesterday

## 2022-09-02 NOTE — ED Provider Notes (Signed)
[facility] Provider Note    Event Date/Time   First MD Initiated Contact with Patient 09/02/22 1242     (approximate)   History   Chief Complaint Neck Pain   HPI  ROSELEE PONTARELLI is a 32 y.o. female with no significant past medical history who presents to the ED complaining of neck pain.  Patient reports that she has been dealing with recurrent "sinus infections" since the beginning of this year.  She reports significant pressure in the area of her maxillary sinuses, particularly on the left side.  This been associated with thick drainage from both sides of her nose, she also reports occasional drainage from her left ear.  She was seen by her PCP for this problem 1 month ago and prescribed Augmentin along with a course of steroids and a nasal spray.  She states that she feels no better since then and developed a fever of 101 last night.  She has not taken any Tylenol or ibuprofen today, denies any fevers today.  She does report increasing headache with stiffness in her neck over the past week, was initially seen at her PCPs office and referred to the ED for further evaluation.     Physical Exam   Triage Vital Signs: ED Triage Vitals  Enc Vitals Group     BP 09/02/22 1141 127/74     Pulse Rate 09/02/22 1141 81     Resp 09/02/22 1141 18     Temp 09/02/22 1139 98.1 F (36.7 C)     Temp Source 09/02/22 1139 Oral     SpO2 09/02/22 1141 100 %     Weight 09/02/22 1141 159 lb (72.1 kg)     Height 09/02/22 1141 5\' 7"  (1.702 m)     Head Circumference --      Peak Flow --      Pain Score 09/02/22 1139 10     Pain Loc --      Pain Edu? --      Excl. in GC? --     Most recent vital signs: Vitals:   09/02/22 1139 09/02/22 1141  BP:  127/74  Pulse:  81  Resp:  18  Temp: 98.1 F (36.7 C)   SpO2:  100%    Constitutional: Alert and oriented. Eyes: Conjunctivae are normal. Head: Atraumatic. Nose: No congestion/rhinnorhea. Mouth/Throat: Mucous  membranes are moist.  Ears: TMs clear bilaterally with no erythema, bulging, or drainage. Neck: Supple with no meningismus. Cardiovascular: Normal rate, regular rhythm. Grossly normal heart sounds.  2+ radial pulses bilaterally. Respiratory: Normal respiratory effort.  No retractions. Lungs CTAB. Gastrointestinal: Soft and nontender. No distention. Musculoskeletal: No lower extremity tenderness nor edema.  Neurologic:  Normal speech and language. No gross focal neurologic deficits are appreciated.    ED Results / Procedures / Treatments   Labs (all labs ordered are listed, but only abnormal results are displayed) Labs Reviewed  URINALYSIS, ROUTINE W REFLEX MICROSCOPIC - Abnormal; Notable for the following components:      Result Value   Color, Urine YELLOW (*)    APPearance HAZY (*)    All other components within normal limits  CBC WITH DIFFERENTIAL/PLATELET  BASIC METABOLIC PANEL  POC URINE PREG, ED   RADIOLOGY CT head reviewed and interpreted by me with no hemorrhage or midline shift.  PROCEDURES:  Critical Care performed: No  Procedures   MEDICATIONS ORDERED IN ED: Medications - No data to display   IMPRESSION / MDM / ASSESSMENT  AND PLAN / ED COURSE  I reviewed the triage vital signs and the nursing notes.                              32 y.o. female with no significant past medical history presents to the ED with recurrent sinus drainage and pressure over the past month along with fever yesterday and increasing headache.  Patient's presentation is most consistent with acute presentation with potential threat to life or bodily function.  Differential diagnosis includes, but is not limited to, meningitis, sinusitis, SAH, otitis media, otitis externa.  Patient well-appearing and in no acute distress, vital signs are unremarkable.  She was referred to the ED from [facility] clinic due to concern for possible meningitis but I see no signs of meningitis on exam.  She is  well-appearing with no neck stiffness whatsoever, currently afebrile and has not had any Tylenol or ibuprofen today.  Ear exam is clear bilaterally, patient does report significant purulent drainage from her nose.  CT imaging negative for intracranial process but does show left maxillary sinusitis, which correlates with patient's symptoms.  She would benefit from additional course of antibiotics, states she has been on Augmentin and doxycycline before, we will treat with Levaquin.  She was consulted to continue nasal sprays and to return to the ED for new or worsening symptoms, will provide referral to ENT for follow-up.  Patient agrees with plan.      FINAL CLINICAL IMPRESSION(S) / ED DIAGNOSES   Final diagnoses:  Acute recurrent maxillary sinusitis     Rx / DC Orders   ED Discharge Orders          Ordered    levofloxacin (LEVAQUIN) 750 MG tablet  Daily        09/02/22 1338             Note:  This document was prepared using Dragon voice recognition software and may include unintentional dictation errors.   Chesley Noon, MD 09/02/22 1344

## 2022-09-13 ENCOUNTER — Other Ambulatory Visit: Payer: Self-pay

## 2022-09-13 ENCOUNTER — Ambulatory Visit
Admission: RE | Admit: 2022-09-13 | Discharge: 2022-09-13 | Disposition: A | Discharge status: Home / Self Care | Payer: Managed Care, Other (non HMO) | Source: Ambulatory Visit | Attending: Internal Medicine | Admitting: Internal Medicine

## 2022-09-13 DIAGNOSIS — R310 Gross hematuria: Secondary | ICD-10-CM | POA: Diagnosis present

## 2022-09-13 MED ORDER — IOHEXOL 300 MG/ML  SOLN
100.0000 mL | Freq: Once | INTRAMUSCULAR | Status: AC | PRN
  Administered 2022-09-13: 100 mL via INTRAVENOUS

## 2022-09-19 ENCOUNTER — Encounter: Payer: Self-pay | Admitting: Urology

## 2022-09-19 ENCOUNTER — Telehealth: Payer: Self-pay | Admitting: Urology

## 2022-09-19 ENCOUNTER — Ambulatory Visit (INDEPENDENT_AMBULATORY_CARE_PROVIDER_SITE_OTHER): Payer: Managed Care, Other (non HMO) | Admitting: Urology

## 2022-09-19 VITALS — BP 117/78 | HR 73 | Ht 67.0 in | Wt 160.0 lb

## 2022-09-19 DIAGNOSIS — R102 Pelvic and perineal pain: Secondary | ICD-10-CM

## 2022-09-19 DIAGNOSIS — R310 Gross hematuria: Secondary | ICD-10-CM

## 2022-09-19 DIAGNOSIS — N3001 Acute cystitis with hematuria: Secondary | ICD-10-CM

## 2022-09-19 DIAGNOSIS — R300 Dysuria: Secondary | ICD-10-CM | POA: Diagnosis not present

## 2022-09-19 MED ORDER — DOXYCYCLINE HYCLATE 100 MG PO CAPS: 100.0000 mg | ORAL_CAPSULE | Freq: Two times a day (BID) | ORAL | 0 refills | Status: AC

## 2022-09-19 NOTE — Telephone Encounter (Signed)
Patient was seen in the office this morning, and called regarding a complaint she has. Please call her at [phone].

## 2022-09-19 NOTE — Patient Instructions (Signed)
No worrisome findings in your bladder.  This may be from an infection or inflammation in your bladder, I would recommend taking doxycycline antibiotics x 7 days, you can take Azo or Pyridium over-the-counter to help with burning.  I would also recommend taking Aleve or ibuprofen daily for the next week.  It may take about a week for your symptoms to totally resolve  We will see you back in clinic in about 2 weeks to check in

## 2022-09-19 NOTE — Telephone Encounter (Signed)
Pt states the medical assistant today was rude and unprofessional.   She states she asked  the cma was the procedure going to hurt and the assistant said it shouldn't but you younger girls are cry babies.   When the assistant was prepping  her for the procedure she asked her how many kids she had and the pt states 3 why. The assistant said oh I can tell from down here.   She also made a comment that an ingrown hair the patient had seems dirty.   The patient was very offended by these comments. She states the Dr was Artist. The staff that answered the phone and checked her in were great. She loved everyone she interacted with and was shocked at how unprofessional the CMA was. She understands that the CMA may have had an off day. She wanted to bring this to my attention so it doesn't happen to anyone else. She declines to see her again.   I thanked patient for her feedback. Informed her that her concerns will be addressed. Pt was appreciative of call.

## 2022-09-19 NOTE — Progress Notes (Signed)
   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  Urinalysis with PCP showed 3 squamous cells, greater than 40 WBC, greater than 100 RBC, 0-5 bacteria, 2+ leukocytes.  Culture was negative.  She was given 3 days of Cipro with minimal change in symptoms, and had a CT urogram on 09/13/2022 due to the degree of hematuria.  This showed a focal area of possible thickening in the right posterior bladder wall-on my review of the images this could represent irregular filling of contrast in the bladder, blood clot, or mass.   PMH: Past Medical History:  Diagnosis Date   Breast mass    Sciatic pain    SOB (shortness of breath) on exertion     Surgical History: Past Surgical History:  Procedure Laterality Date   EYE SURGERY     Family History: Family History  Problem Relation Age of Onset   Hypertension Mother     Social History:  reports that she has never smoked. She has never used smokeless tobacco. She reports current alcohol use. She reports that she does not use drugs.  Physical Exam: BP 117/78   Pulse 73   Ht 5\' 7"  (1.702 m)   Wt 160 lb (72.6 kg)   BMI 25.06 kg/m    Constitutional:  Alert and oriented, No acute distress. Cardiovascular: No clubbing, cyanosis, or edema. Respiratory: Normal respiratory effort, no increased work of breathing. GI: Abdomen is soft, nontender, nondistended, no abdominal masses   Laboratory Data: Reviewed, see HPI  Pertinent Imaging: I have personally viewed and interpreted the CT urogram dated 6/14 with questionable filling defect in the bladder at the right base, this could represent mass, irregular filling of contrast in the bladder, or blood  clot.  --------------------------------------------------------------------------------------------------------------------------------------------  Cystoscopy Procedure Note:  Indication: Gross hematuria, possible bladder mass  After informed consent and discussion of the procedure and its risks, Krystal Richard was positioned and prepped in the standard fashion. Cystoscopy was performed with a flexible cystoscope. The urethra, bladder neck and entire bladder was visualized in a standard fashion. The ureteral orifices were visualized in their normal location and orientation.  Bladder mucosa was grossly normal throughout, no suspicious lesions, no abnormalities on retroflexion.  Urine was clear, no bleeding noted.  ---------------------------------------------------------------------------------------------------------------------------------------------  Assessment & Plan:   32 year old female with acute onset of pelvic pressure, dysuria, and gross hematuria starting 09/13/2022, urinalysis originally did show pyuria and gross hematuria, and secondary to the degree of gross hematuria PCP ordered a CT urogram.  CT showed a possible filling defect in the bladder.  Cystoscopy today shows no abnormalities, and this was likely related to either abnormal filling of contrast, or blood clot.  We reviewed possible etiologies including inflammation, infection(viral/atypical), IC, or less likely renal papillary necrosis.  No evidence of malignancy on CT and cystoscopy.  I recommended 7 days of doxycycline for possible atypical infection, NSAIDs, Azo/Pyridium for dysuria RTC with PA 2 weeks symptom check    Legrand Rams, MD 09/19/2022  [facility] [address] [place], [place] [address] [phone]

## 2022-10-04 ENCOUNTER — Ambulatory Visit: Payer: Managed Care, Other (non HMO) | Admitting: Physician Assistant

## 2022-10-11 ENCOUNTER — Encounter: Payer: Self-pay | Admitting: Physician Assistant

## 2022-10-11 ENCOUNTER — Ambulatory Visit: Payer: Managed Care, Other (non HMO) | Admitting: Physician Assistant

## 2023-08-12 ENCOUNTER — Emergency Department
Admission: EM | Admit: 2023-08-12 | Discharge: 2023-08-12 | Disposition: A | Discharge status: Home / Self Care | Attending: Emergency Medicine | Admitting: Emergency Medicine

## 2023-08-12 ENCOUNTER — Other Ambulatory Visit: Payer: Self-pay

## 2023-08-12 DIAGNOSIS — G43101 Migraine with aura, not intractable, with status migrainosus: Secondary | ICD-10-CM | POA: Insufficient documentation

## 2023-08-12 DIAGNOSIS — R519 Headache, unspecified: Secondary | ICD-10-CM | POA: Diagnosis present

## 2023-08-12 MED ORDER — METOCLOPRAMIDE HCL 10 MG PO TABS
10.0000 mg | ORAL_TABLET | Freq: Once | ORAL | Status: AC
  Administered 2023-08-12: 10 mg via ORAL
  Filled 2023-08-12: qty 1

## 2023-08-12 MED ORDER — METOCLOPRAMIDE HCL 10 MG PO TABS: 10.0000 mg | ORAL_TABLET | Freq: Four times a day (QID) | ORAL | 0 refills | Status: AC | PRN

## 2023-08-12 MED ORDER — DIPHENHYDRAMINE HCL 25 MG PO CAPS
25.0000 mg | ORAL_CAPSULE | Freq: Once | ORAL | Status: AC
  Administered 2023-08-12: 25 mg via ORAL
  Filled 2023-08-12: qty 1

## 2023-08-12 MED ORDER — NAPROXEN 500 MG PO TABS
500.0000 mg | ORAL_TABLET | Freq: Once | ORAL | Status: AC
  Administered 2023-08-12: 500 mg via ORAL
  Filled 2023-08-12: qty 1

## 2023-08-12 MED ORDER — NAPROXEN 500 MG PO TABS: 500.0000 mg | ORAL_TABLET | Freq: Two times a day (BID) | ORAL | 0 refills | Status: AC

## 2023-08-12 NOTE — ED Provider Notes (Signed)
-----------------------------------------   3:28 PM on 08/12/2023 -----------------------------------------  Blood pressure 126/67, pulse 79, temperature 98.6 F (37 C), temperature source Oral, resp. rate 16, weight 72.6 kg, SpO2 100%.  Assuming care from Dr. Vicenta Graft.  In short, Krystal Richard is a 33 y.o. female with a chief complaint of Headache .  Refer to the original H&P for additional details.  The current plan of care is to reassess following medications for headache.  ----------------------------------------- 3:51 PM on 08/12/2023 ----------------------------------------- Patient reports feeling much better following medications for headache, she is appropriate for discharge home with outpatient follow-up.  She has been provided with prescriptions for ongoing headache management per Dr. Vicenta Graft.  She was counseled to return to the ED for new or worsening symptoms, patient agrees with plan.       Twilla Galea, MD 08/12/23 [phone]

## 2023-08-12 NOTE — ED Triage Notes (Signed)
 C/O headache x 4 days and c/o blurred vision to right eye since last night at around 2200.  AAOx3.  Skin warm and dry. NAD.  MAE equally and strong. NO numbness. Gait steady

## 2023-08-12 NOTE — ED Provider Notes (Signed)
 [facility] Provider Note    Event Date/Time   First MD Initiated Contact with Patient 08/12/23 1413     (approximate)   History   Chief Complaint: Headache   HPI  Krystal Richard is a 33 y.o. female with no significant past medical history who comes to the ED for evaluation of headache.  Right sided, retro-orbital, associated with intermittent tearing and visual disturbance that sounds like some lending scotoma.  Associated photophobia and phonophobia.  Not sudden/thunderclap onset, no neck pain, no fever.  Tried aspirin without relief.        Past Medical History:  Diagnosis Date   Breast mass    Sciatic pain    SOB (shortness of breath) on exertion     Current Outpatient Rx   Order #: [id number] Class: Normal   Order #: [id number] Class: Historical Med    Past Surgical History:  Procedure Laterality Date   EYE SURGERY      Physical Exam   Triage Vital Signs: ED Triage Vitals  Encounter Vitals Group     BP 08/12/23 1251 126/67     Systolic BP Percentile --      Diastolic BP Percentile --      Pulse Rate 08/12/23 1251 79     Resp 08/12/23 1251 16     Temp 08/12/23 1251 98.6 F (37 C)     Temp Source 08/12/23 1251 Oral     SpO2 08/12/23 1251 100 %     Weight 08/12/23 1249 160 lb 0.9 oz (72.6 kg)     Height --      Head Circumference --      Peak Flow --      Pain Score 08/12/23 1249 9     Pain Loc --      Pain Education --      Exclude from Growth Chart --     Most recent vital signs: Vitals:   08/12/23 1251  BP: 126/67  Pulse: 79  Resp: 16  Temp: 98.6 F (37 C)  SpO2: 100%    General: Awake, no distress.  CV:  Good peripheral perfusion.  Regular rate and rhythm Resp:  Normal effort.  Abd:  No distention.  Other:  Cranial nerves II through XII intact.  No nystagmus.  No midline spinal tenderness or meningismus.   ED Results / Procedures / Treatments   Labs (all labs ordered are listed, but only abnormal  results are displayed) Labs Reviewed - No data to display   EKG    RADIOLOGY    PROCEDURES:  Procedures   MEDICATIONS ORDERED IN ED: Medications  naproxen (NAPROSYN) tablet 500 mg (has no administration in time range)  metoCLOPramide  (REGLAN ) tablet 10 mg (has no administration in time range)  diphenhydrAMINE  (BENADRYL ) capsule 25 mg (has no administration in time range)     IMPRESSION / MDM / ASSESSMENT AND PLAN / ED COURSE  I reviewed the triage vital signs and the nursing notes.  DDx: Classic migraine  Patient's presentation is most consistent with exacerbation of chronic illness.  Patient presents with a recurrent headache syndrome, consistent with a classic migraine. Considering the patient's symptoms, medical history, and physical examination today, I have low suspicion for ischemic stroke, intracranial hemorrhage, meningitis, encephalitis, carotid or vertebral dissection, venous sinus thrombosis, MS, intracranial hypertension, glaucoma, CRAO, CRVO, or temporal arteritis.  Reviewed CT head result from September 02, 2022, negative.  Repeat imaging not necessary in the ED today.  Patient has followed  up with primary care who will plan to obtain outpatient MRI and ophthalmology evaluation.  Patient is calm, nontoxic, in good spirits.  Will give medications for migraine headache, would be stable for discharge if symptoms improve.       FINAL CLINICAL IMPRESSION(S) / ED DIAGNOSES   Final diagnoses:  Migraine with aura and with status migrainosus, not intractable     Rx / DC Orders   ED Discharge Orders     None        Note:  This document was prepared using Dragon voice recognition software and may include unintentional dictation errors.   Jacquie Maudlin, MD 08/12/23 1435

## 2023-08-12 NOTE — ED Notes (Signed)
 Patient states had blood drawn just PTA at PCP.

## 2023-08-12 NOTE — ED Notes (Signed)
 Pt has a spot at the top of her head where it is visible hair loss. Pt says that area of her head is hurting and it radiates to the front right side of her head. Pt states that she has had the discomfort for a while, but it is getting worse, and the head pain is causing issues with her vision in her left eye.

## 2023-12-16 IMAGING — US US EXTREM UP *R* LTD
1 series · 14 of 16 positions shown · non-contrast
Comparison: None.

CLINICAL DATA: Right arm mass

EXAM:
ULTRASOUND RIGHT UPPER EXTREMITY LIMITED
TECHNIQUE: Ultrasound examination of the upper extremity soft tissues was
performed in the area of clinical concern.

[Series 1: us soft tissue right upper extremity limited (non- · 16 acquisitions, 14 frames shown]
[im 1/16]
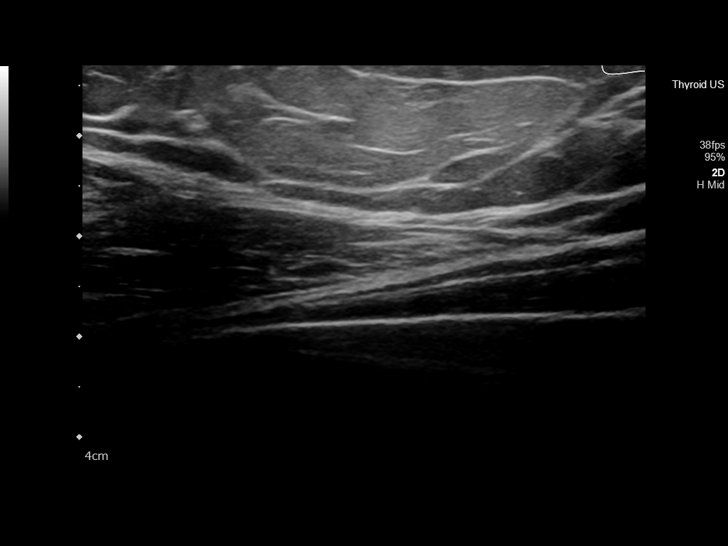
[im 2/16]
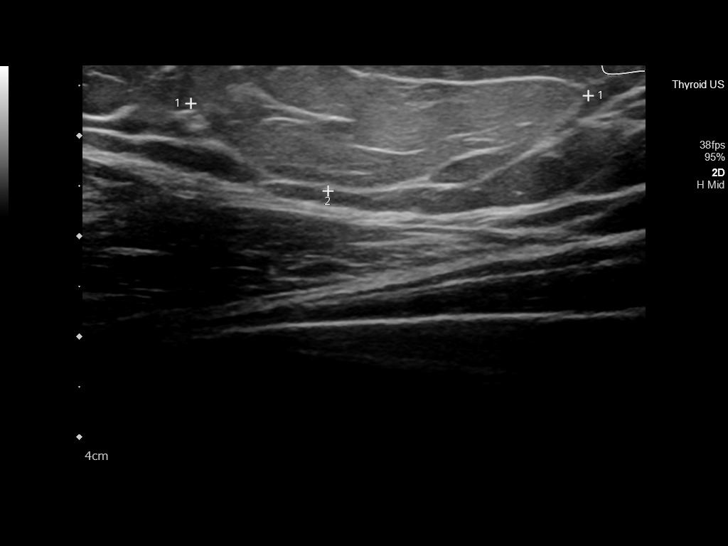
[im 3/16]
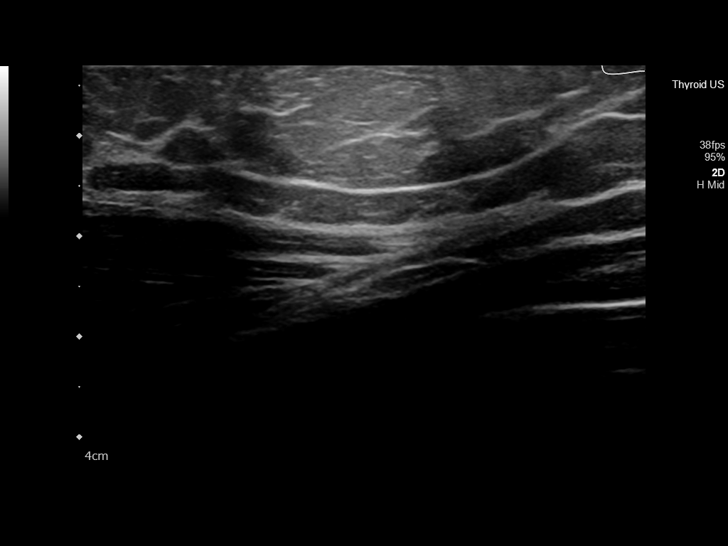
[im 5/16]
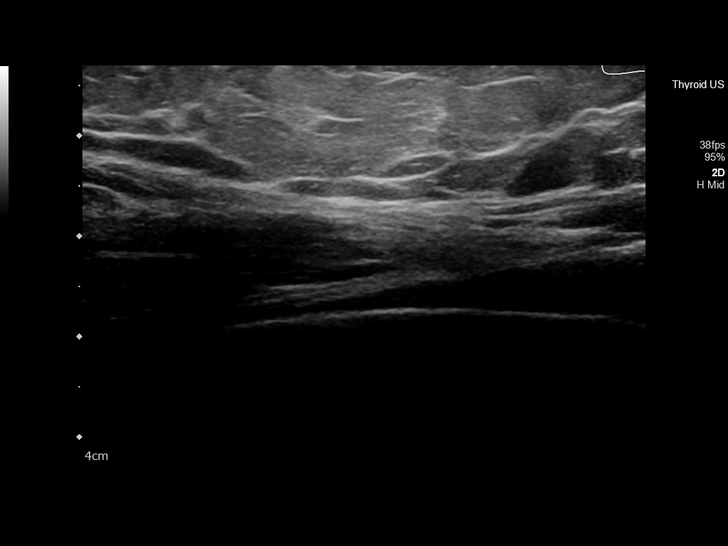
[im 6/16]
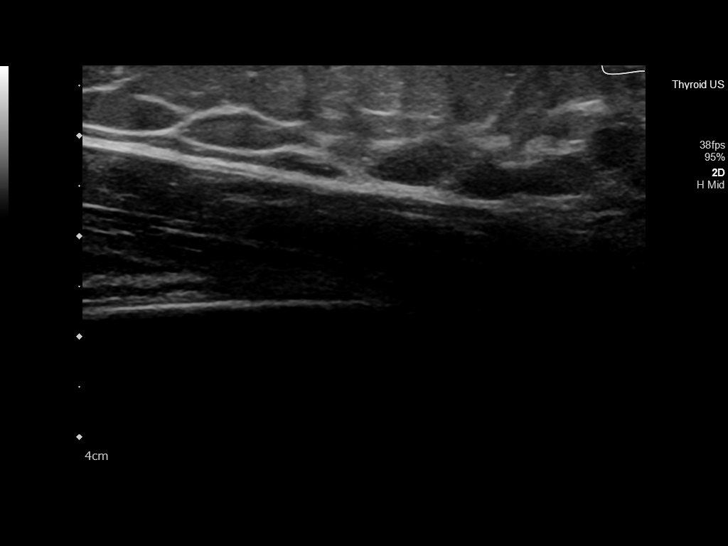
[im 7/16]
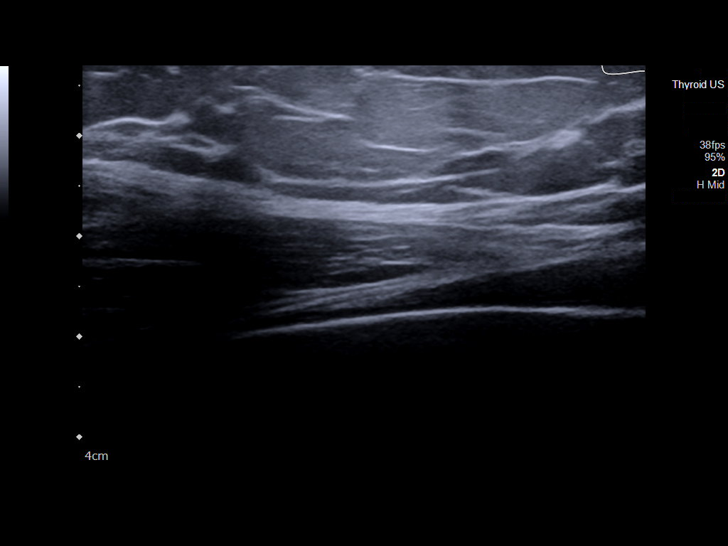
[im 8/16]
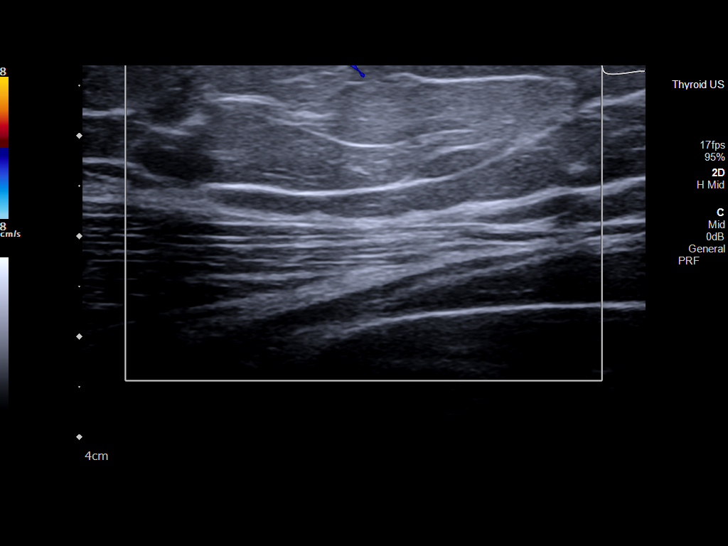
[im 9/16]
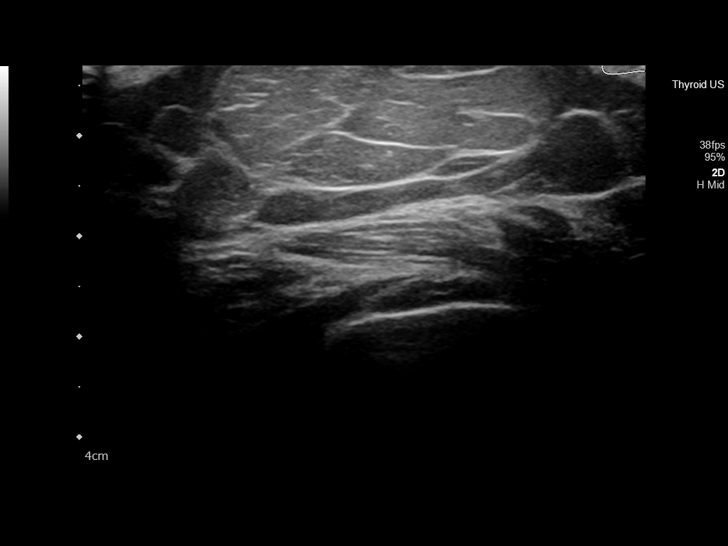
[im 10/16]
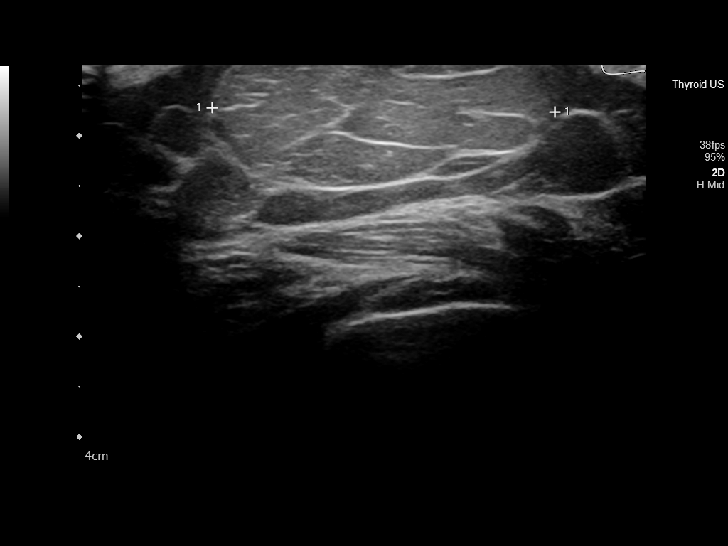
[im 11/16]
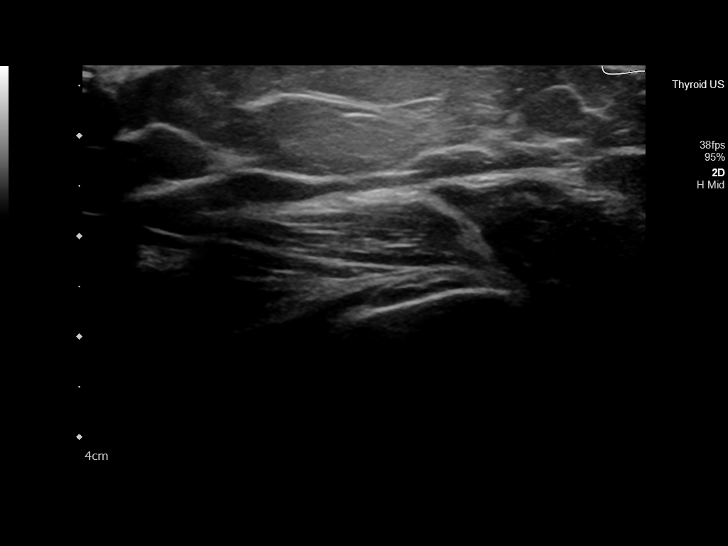
[im 13/16]
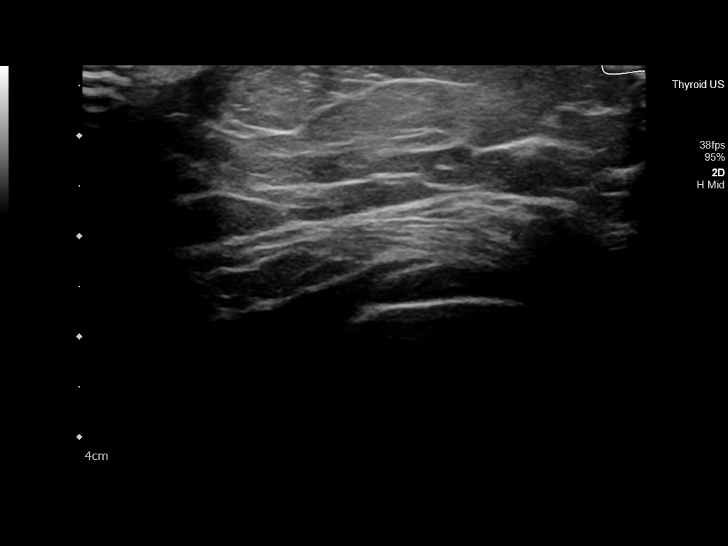
[im 14/16]
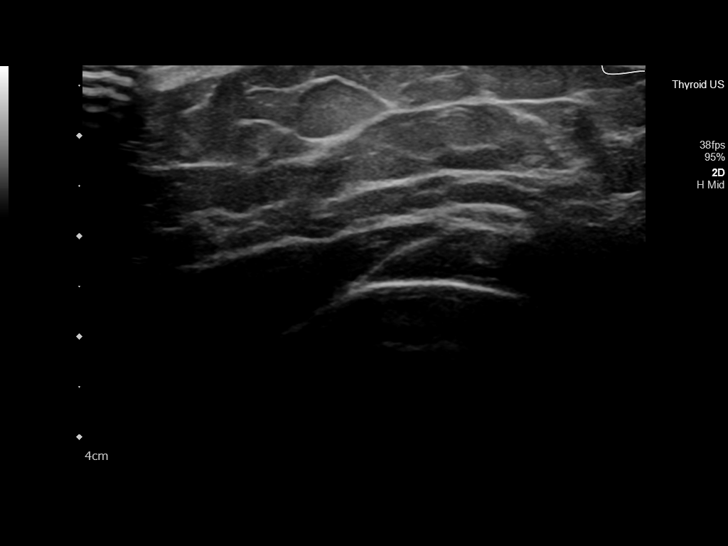
[im 15/16]
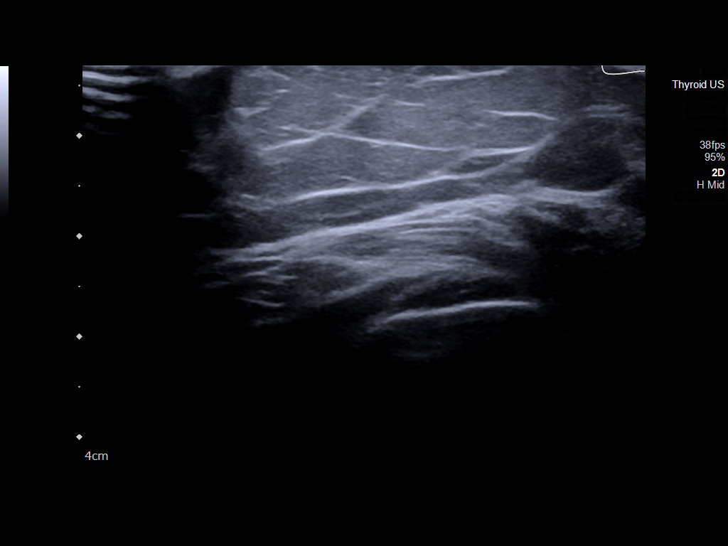
[im 16/16]
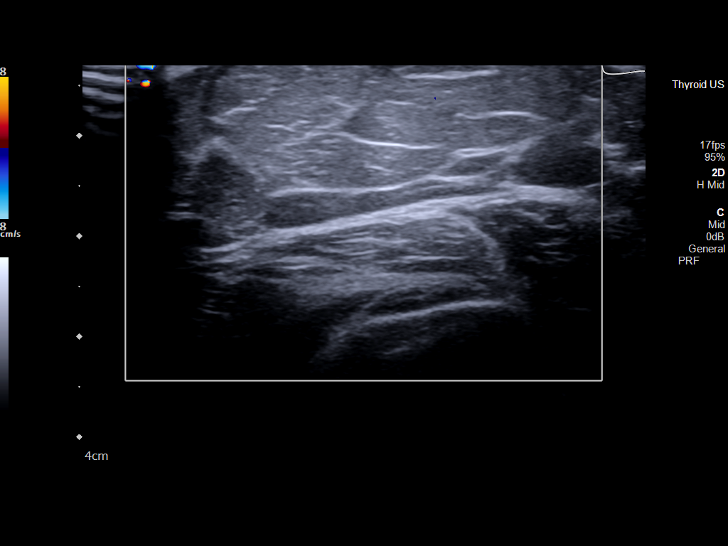

[14 of 16 positions shown; findings below may reference images not displayed]

FINDINGS: Corresponding to the palpable area of concern, there is a slightly
hyperechoic mass in the subcutaneous tissues measuring 4.0 x 1.3 x
3.4 cm. There is no significant internal vascularity.
IMPRESSION: Subcutaneous mass measuring 4.0 x 1.3 x 3.4 cm corresponding to the
palpable area of concern consistent with a lipoma.
